# Patient Record
Sex: Male | Born: 1955 | Race: White | Hispanic: Yes | Smoking: Former smoker
Health system: Southern US, Community
[De-identification: ages and names within clinical notes are randomized; demographics above are authoritative.]

## PROBLEM LIST (undated history)

## (undated) DIAGNOSIS — F419 Anxiety disorder, unspecified: Secondary | ICD-10-CM

## (undated) DIAGNOSIS — I10 Essential (primary) hypertension: Secondary | ICD-10-CM

## (undated) DIAGNOSIS — F32A Depression, unspecified: Secondary | ICD-10-CM

## (undated) DIAGNOSIS — J302 Other seasonal allergic rhinitis: Secondary | ICD-10-CM

## (undated) DIAGNOSIS — Z973 Presence of spectacles and contact lenses: Secondary | ICD-10-CM

## (undated) DIAGNOSIS — E079 Disorder of thyroid, unspecified: Secondary | ICD-10-CM

## (undated) DIAGNOSIS — Z860101 Personal history of adenomatous and serrated colon polyps: Secondary | ICD-10-CM

## (undated) DIAGNOSIS — Z86010 Personal history of colonic polyps: Secondary | ICD-10-CM

## (undated) DIAGNOSIS — R972 Elevated prostate specific antigen [PSA]: Secondary | ICD-10-CM

## (undated) HISTORY — PX: VASECTOMY: SHX75

---

## 2001-11-05 ENCOUNTER — Emergency Department (HOSPITAL_COMMUNITY): Admission: EM | Admit: 2001-11-05 | Discharge: 2001-11-06 | Payer: Self-pay | Admitting: Emergency Medicine

## 2001-11-06 ENCOUNTER — Encounter: Payer: Self-pay | Admitting: Emergency Medicine

## 2003-01-07 ENCOUNTER — Encounter: Payer: Self-pay | Admitting: Emergency Medicine

## 2003-01-07 ENCOUNTER — Emergency Department (HOSPITAL_COMMUNITY): Admission: EM | Admit: 2003-01-07 | Discharge: 2003-01-07 | Payer: Self-pay | Admitting: Emergency Medicine

## 2004-08-01 ENCOUNTER — Emergency Department (HOSPITAL_COMMUNITY): Admission: EM | Admit: 2004-08-01 | Discharge: 2004-08-01 | Payer: Self-pay | Admitting: Emergency Medicine

## 2004-10-06 ENCOUNTER — Emergency Department (HOSPITAL_COMMUNITY): Admission: EM | Admit: 2004-10-06 | Discharge: 2004-10-07 | Payer: Self-pay | Admitting: Emergency Medicine

## 2005-12-25 IMAGING — CR DG CHEST 2V
2 series · 2 of 2 positions shown · non-contrast
Comparison: none

CLINICAL DATA: Weakness.  Lethargy.  
 CHEST - 2 VIEWS: 
 Peribronchial thickening noted without focal airspace disease.  Lungs otherwise clear.  Cardiomediastinal silhouette, bony thorax and upper abdomen are unremarkable.

[view not recorded (1 of 2)]
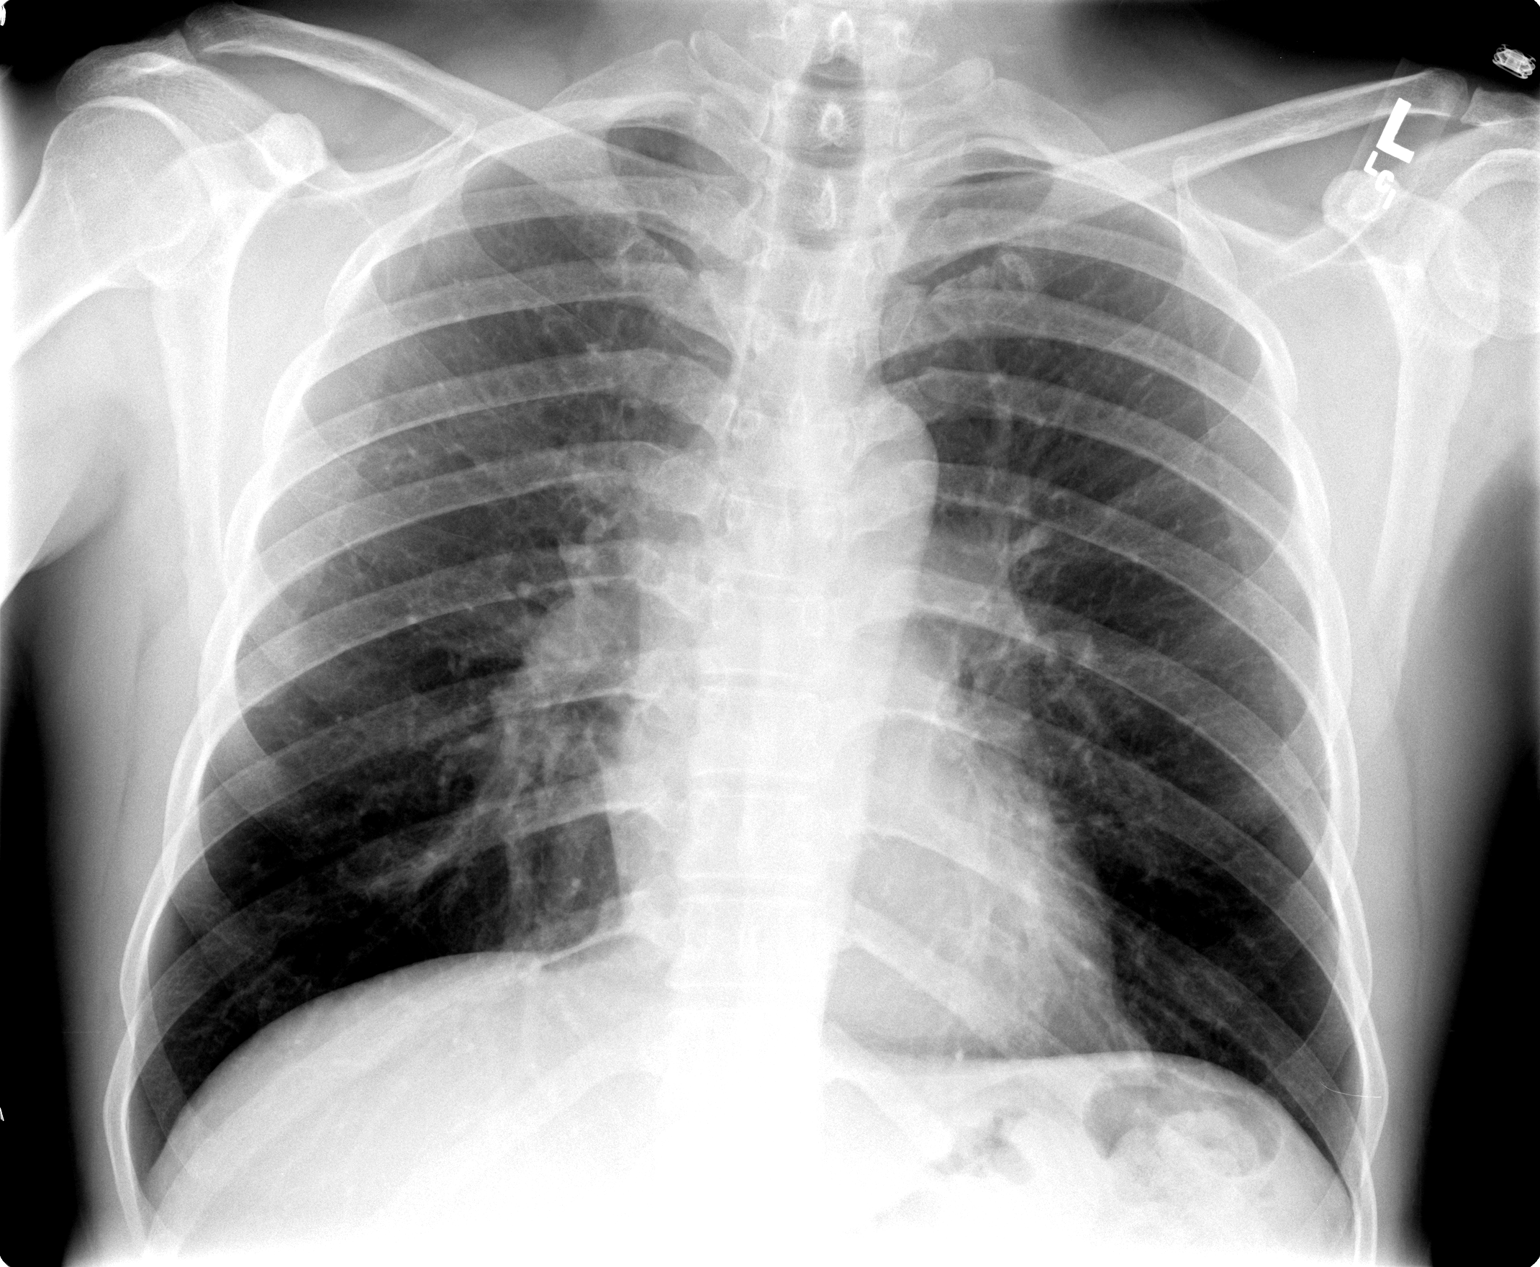

[view not recorded (2 of 2)]
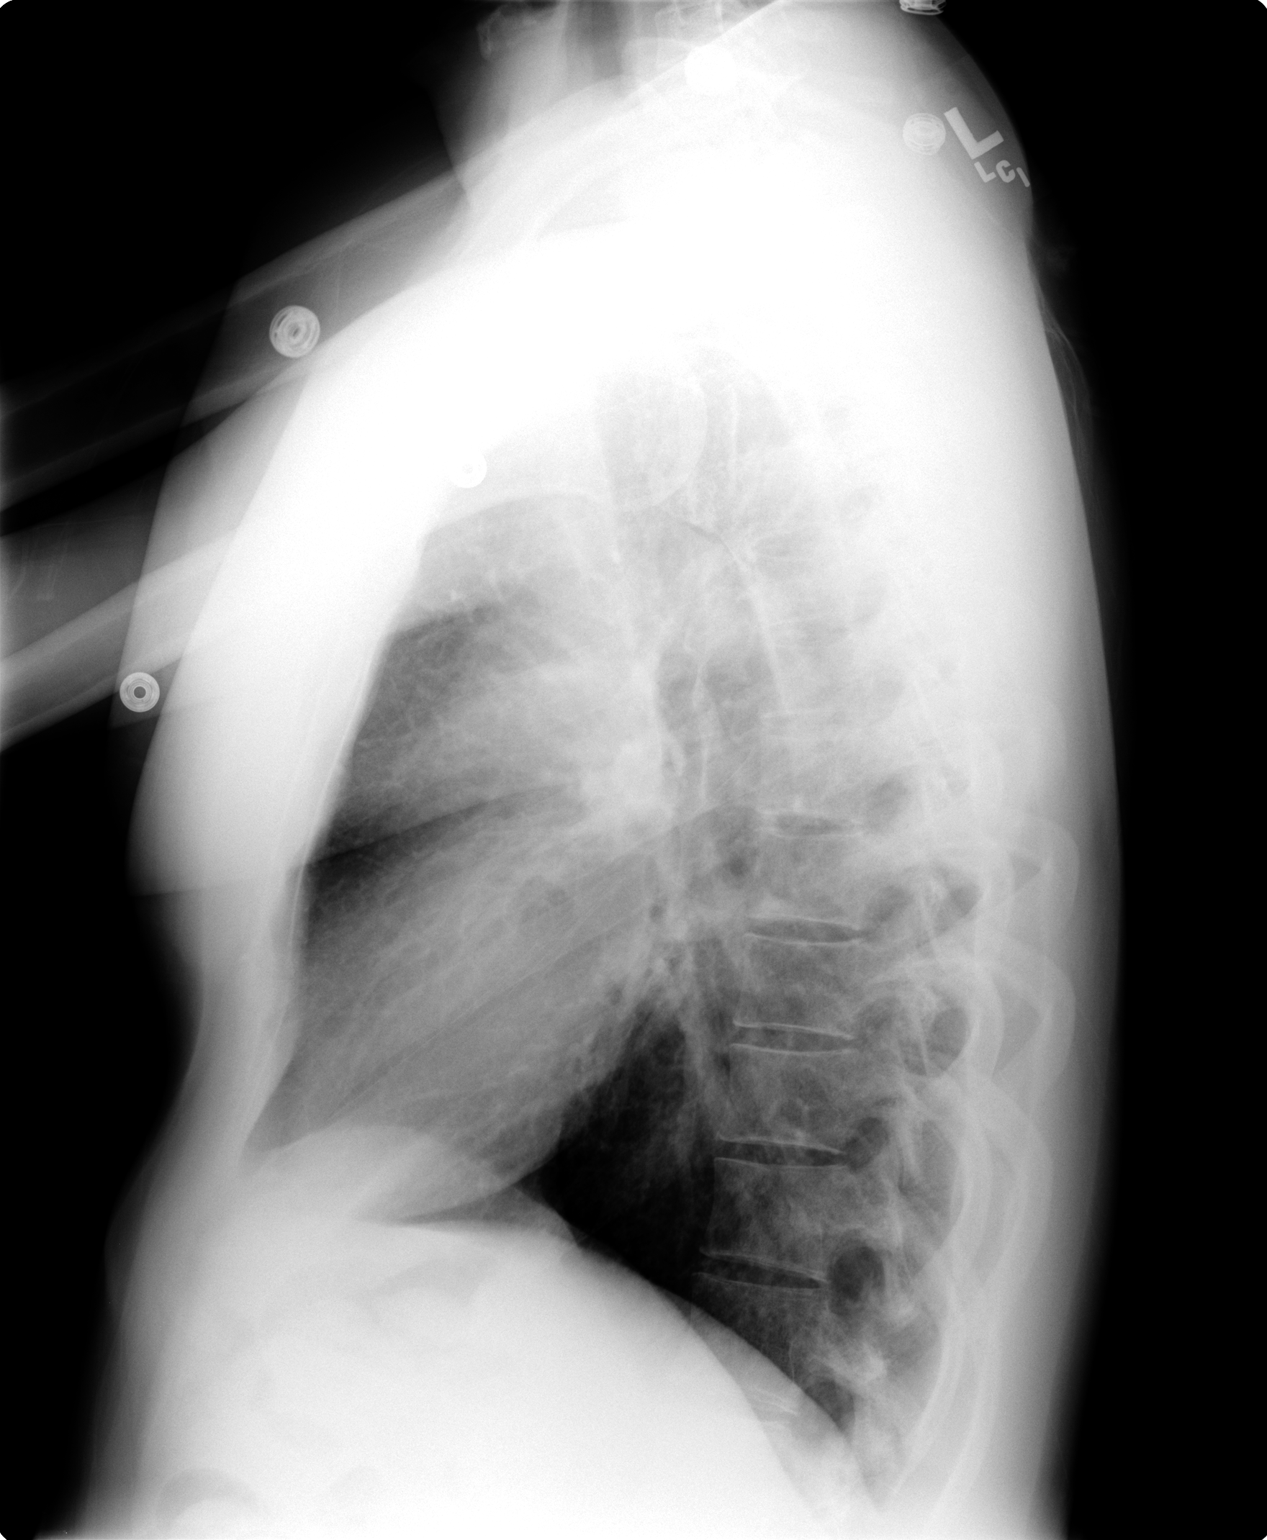

[2 of 2 positions shown; findings below may reference images not displayed]

IMPRESSION: Mild peribronchial thickening without focal airspace disease.

## 2012-05-13 ENCOUNTER — Ambulatory Visit: Payer: Self-pay | Admitting: Cardiology

## 2012-06-19 ENCOUNTER — Ambulatory Visit: Payer: Self-pay | Admitting: Cardiology

## 2012-06-19 ENCOUNTER — Telehealth: Payer: Self-pay | Admitting: *Deleted

## 2012-06-19 NOTE — Telephone Encounter (Signed)
Called Alex Curtis at Dr. Jenene Slicker request b/c Alex Curtis missed appt today. Alex Curtis states that he currently has a family emergency and that he called office earlier to cancel appointment. He will call back at a later time to reschedule.  Dr. Antoine Poche is aware. Mylo Red RN

## 2012-07-04 ENCOUNTER — Encounter: Payer: Self-pay | Admitting: Cardiology

## 2012-12-08 ENCOUNTER — Encounter: Payer: Self-pay | Admitting: Gastroenterology

## 2013-01-12 ENCOUNTER — Ambulatory Visit (AMBULATORY_SURGERY_CENTER): Payer: BC Managed Care – PPO | Admitting: *Deleted

## 2013-01-12 VITALS — Ht 68.0 in | Wt 153.4 lb

## 2013-01-12 DIAGNOSIS — Z1211 Encounter for screening for malignant neoplasm of colon: Secondary | ICD-10-CM

## 2013-01-12 MED ORDER — NA SULFATE-K SULFATE-MG SULF 17.5-3.13-1.6 GM/177ML PO SOLN: 1.0000 | Freq: Once | ORAL | Status: DC

## 2013-01-12 NOTE — Progress Notes (Signed)
No egg or soy allergy. ewm No previous sedation. ewm No home 02 use. ewm Pt very anxious and nervous about iv/needles. Pt states he will  pass out. ewm

## 2013-01-13 ENCOUNTER — Encounter: Payer: Self-pay | Admitting: Gastroenterology

## 2013-01-22 ENCOUNTER — Encounter: Payer: Self-pay | Admitting: Gastroenterology

## 2013-01-22 ENCOUNTER — Ambulatory Visit (AMBULATORY_SURGERY_CENTER): Payer: BC Managed Care – PPO | Admitting: Gastroenterology

## 2013-01-22 VITALS — BP 120/63 | HR 92 | Temp 98.4°F | Resp 21 | Ht 68.0 in | Wt 153.4 lb

## 2013-01-22 DIAGNOSIS — D126 Benign neoplasm of colon, unspecified: Secondary | ICD-10-CM

## 2013-01-22 DIAGNOSIS — Z1211 Encounter for screening for malignant neoplasm of colon: Secondary | ICD-10-CM

## 2013-01-22 HISTORY — PX: COLONOSCOPY WITH PROPOFOL: SHX5780

## 2013-01-22 MED ORDER — SODIUM CHLORIDE 0.9 % IV SOLN: 500.0000 mL | INTRAVENOUS | Status: DC

## 2013-01-22 NOTE — Op Note (Signed)
[facility] [address] [place], [address]   COLONOSCOPY PROCEDURE REPORT  PATIENT: Alex, Curtis  MR#: [id number] BIRTHDATE: [birth year] , 56  yrs. old GENDER: Male ENDOSCOPIST: Louis Meckel, MD REFERRED [id number] Blount, M.D. PROCEDURE DATE:  01/22/2013 PROCEDURE:   Colonoscopy with cold biopsy polypectomy ASA CLASS:   Class I INDICATIONS:average risk screening. MEDICATIONS: MAC sedation, administered by CRNA and propofol (Diprivan) 350mg  IV  DESCRIPTION OF PROCEDURE:   After the risks benefits and alternatives of the procedure were thoroughly explained, informed consent was obtained.  A digital rectal exam revealed no abnormalities of the rectum.   The LB [id number]  endoscope was introduced through the anus and advanced to the cecum, which was identified by both the appendix and ileocecal valve. No adverse events experienced.   The quality of the prep was excellent using Suprep  The instrument was then slowly withdrawn as the colon was fully examined.      COLON FINDINGS: A sessile polyp measuring 2 mm in size was found in the ascending colon.  A polypectomy was performed with cold forceps.  The resection was complete and the polyp tissue was completely retrieved.   Mild diverticulosis was noted in the ascending colon.   The colon mucosa was otherwise normal. Retroflexed views revealed no abnormalities. The time to cecum=2 minutes 17 seconds.  Withdrawal time=7 minutes 50 seconds.  The scope was withdrawn and the procedure completed. COMPLICATIONS: There were no complications.  ENDOSCOPIC IMPRESSION: 1.   Sessile polyp measuring 2 mm in size was found in the ascending colon; polypectomy was performed with cold forceps 2.   Mild diverticulosis was noted in the ascending colon 3.   The colon mucosa was otherwise normal  RECOMMENDATIONS: If the polyp(s) removed today are proven to be adenomatous (pre-cancerous) polyps, you will need a  repeat colonoscopy in 5 years.  Otherwise you should continue to follow colorectal cancer screening guidelines for "routine risk" patients with colonoscopy in 10 years.  You will receive a letter within 1-2 weeks with the results of your biopsy as well as final recommendations.  Please call my office if you have not received a letter after 3 weeks.   eSigned:  Louis Meckel, MD 01/22/2013 11:41 AM   cc:   PATIENT NAME:  Alex, Curtis MR#: [id number]

## 2013-01-22 NOTE — Progress Notes (Signed)
Pt is very nervous.  He was extremely nervous about IV.  Clide Cliff, RN did an excellent job with IV placement in right ac.  I encouraged the pt to take slow deep breaths while she placed IV.  The pt followed commands well and tolerated it ok.  He was relieved once IV was in place.  Pt still anxious about the colonoscopy.  I explained what was to happened and he did seem to be thankful.  Maw

## 2013-01-22 NOTE — Patient Instructions (Addendum)

## 2013-01-22 NOTE — Progress Notes (Signed)
Patient did not experience any of the following events: a burn prior to discharge; a fall within the facility; wrong site/side/patient/procedure/implant event; or a hospital transfer or hospital admission upon discharge from the facility. (G8907) Patient did not have preoperative order for IV antibiotic SSI prophylaxis. (G8918)  

## 2013-01-22 NOTE — Progress Notes (Signed)
Called to room to assist during endoscopic procedure.  Patient ID and intended procedure confirmed with present staff. Received instructions for my participation in the procedure from the performing physician.  

## 2013-01-23 ENCOUNTER — Telehealth: Payer: Self-pay | Admitting: *Deleted

## 2013-01-23 NOTE — Telephone Encounter (Signed)
No answer, left message to call if questions or concerns. 

## 2013-02-02 ENCOUNTER — Encounter: Payer: Self-pay | Admitting: Gastroenterology

## 2013-07-04 ENCOUNTER — Emergency Department (HOSPITAL_COMMUNITY)
Admission: EM | Admit: 2013-07-04 | Discharge: 2013-07-04 | Disposition: A | Discharge status: Home / Self Care | Payer: BC Managed Care – PPO | Attending: Emergency Medicine | Admitting: Emergency Medicine

## 2013-07-04 ENCOUNTER — Encounter (HOSPITAL_COMMUNITY): Payer: Self-pay | Admitting: Emergency Medicine

## 2013-07-04 ENCOUNTER — Emergency Department (HOSPITAL_COMMUNITY): Payer: BC Managed Care – PPO

## 2013-07-04 DIAGNOSIS — R002 Palpitations: Secondary | ICD-10-CM | POA: Insufficient documentation

## 2013-07-04 DIAGNOSIS — I10 Essential (primary) hypertension: Secondary | ICD-10-CM | POA: Insufficient documentation

## 2013-07-04 DIAGNOSIS — Z79899 Other long term (current) drug therapy: Secondary | ICD-10-CM | POA: Insufficient documentation

## 2013-07-04 DIAGNOSIS — R000 Tachycardia, unspecified: Secondary | ICD-10-CM

## 2013-07-04 DIAGNOSIS — E0590 Thyrotoxicosis, unspecified without thyrotoxic crisis or storm: Secondary | ICD-10-CM | POA: Insufficient documentation

## 2013-07-04 DIAGNOSIS — F411 Generalized anxiety disorder: Secondary | ICD-10-CM | POA: Insufficient documentation

## 2013-07-04 DIAGNOSIS — R079 Chest pain, unspecified: Secondary | ICD-10-CM | POA: Insufficient documentation

## 2013-07-04 DIAGNOSIS — Z87891 Personal history of nicotine dependence: Secondary | ICD-10-CM | POA: Insufficient documentation

## 2013-07-04 HISTORY — DX: Disorder of thyroid, unspecified: E07.9

## 2013-07-04 HISTORY — DX: Anxiety disorder, unspecified: F41.9

## 2013-07-04 HISTORY — DX: Essential (primary) hypertension: I10

## 2013-07-04 LAB — CBC WITH DIFFERENTIAL/PLATELET
Basophils Absolute: 0 10*3/uL (ref 0.0–0.1)
Eosinophils Absolute: 0.1 10*3/uL (ref 0.0–0.7)
Eosinophils Relative: 3 % (ref 0–5)
HCT: 43 % (ref 39.0–52.0)
Lymphocytes Relative: 40 % (ref 12–46)
MCH: 28.1 pg (ref 26.0–34.0)
MCV: 76.9 fL — ABNORMAL LOW (ref 78.0–100.0)
Monocytes Absolute: 0.5 10*3/uL (ref 0.1–1.0)
Monocytes Relative: 10 % (ref 3–12)
Platelets: 204 10*3/uL (ref 150–400)
RDW: 12.8 % (ref 11.5–15.5)
WBC: 5.2 10*3/uL (ref 4.0–10.5)

## 2013-07-04 LAB — BASIC METABOLIC PANEL
CO2: 24 mEq/L (ref 19–32)
Calcium: 9.5 mg/dL (ref 8.4–10.5)
Creatinine, Ser: 0.71 mg/dL (ref 0.50–1.35)
GFR calc non Af Amer: >90 mL/min (ref >90)
Glucose, Bld: 101 mg/dL — ABNORMAL HIGH (ref 70–99)
Sodium: 139 mEq/L (ref 135–145)

## 2013-07-04 LAB — POCT I-STAT TROPONIN I: Troponin i, poc: 0 ng/mL (ref 0.00–0.08)

## 2013-07-04 MED ORDER — ASPIRIN EC 325 MG PO TBEC
325.0000 mg | DELAYED_RELEASE_TABLET | Freq: Once | ORAL | Status: AC
  Administered 2013-07-04: 325 mg via ORAL
  Filled 2013-07-04: qty 1

## 2013-07-04 NOTE — ED Notes (Signed)
Patient transported to X-ray 

## 2013-07-04 NOTE — ED Notes (Signed)
Dr. Lynelle Doctor at the bedside. Pt slowed down to 88 on the monitor at this time.

## 2013-07-04 NOTE — ED Notes (Signed)
Dr. Post at the bedside.

## 2013-07-04 NOTE — ED Notes (Signed)
Pt reports chest pain and fast HR since noon today. Pt denies any SOB or N/V. Pt did report diaphoresis. Started a new medication a week ago on Methimazole

## 2013-07-04 NOTE — ED Notes (Signed)
Pt asks if the heater is on. Feels like he is burning from the inside.

## 2013-07-04 NOTE — ED Provider Notes (Signed)
CSN: [id number]     Arrival date & time 07/04/13  1739 History   None    Chief Complaint  Patient presents with  . Tachycardia   (Consider location/radiation/quality/duration/timing/severity/associated sxs/prior Treatment) HPI Comments: 57 year old male with history of hyperthyroidism hypertension who comes in with complaints of palpitations/tachycardia and chest pain. Approximately noon today patient began having palpitations. States he did feel his heart racing. Along with this palpitation feeling he was having chest pain which he described as a chest discomfort. Unable to further qualification pain. States this was 6/10 located in the central region his chest without radiation. It is been fairly constant since noon. Patient began taking methimazol yesterday for hyperthyroidism. Has never had symptoms like this before. No other changes in health. Has been taking all medications as direct   Patient is a 57 y.o. male presenting with palpitations.  Palpitations Palpitations quality:  Fast Onset quality:  Sudden Duration:  6 hours Timing:  Constant Progression:  Unchanged Chronicity:  New Context: not anxiety   Context comment:  Hyperthyroid just started methamazole Relieved by:  Nothing Associated symptoms: chest pain   Associated symptoms: no shortness of breath       Past Medical History  Diagnosis Date  . Thyroid disease   . Anxiety   . Hypertension    Past Surgical History  Procedure Laterality Date  . Vasectomy     Family History  Problem Relation Age of Onset  . Hypertension Mother   . Colon cancer Neg Hx   . Esophageal cancer Neg Hx   . Rectal cancer Neg Hx   . Stomach cancer Neg Hx    History  Substance Use Topics  . Smoking status: Former Games developer  . Smokeless tobacco: Never Used  . Alcohol Use: 0.0 oz/week     Comment: 1 glass of wine once a week    Review of Systems  Constitutional: Negative for fatigue.  Respiratory: Negative for shortness of breath.    Cardiovascular: Positive for chest pain and palpitations.  Genitourinary: Negative for dysuria.  Skin: Negative for rash.  Neurological: Negative for headaches.  Psychiatric/Behavioral: Negative for behavioral problems.  All other systems reviewed and are negative.    Allergies  Review of patient's allergies indicates no known allergies.  Home Medications   Current Outpatient Rx  Name  Route  Sig  Dispense  Refill  . methimazole (TAPAZOLE) 10 MG tablet   Oral   Take 10 mg by mouth 2 (two) times daily.         Marland Kitchen PARoxetine (PAXIL) 20 MG tablet   Oral   Take 20 mg by mouth every morning.         . propranolol ER (INDERAL LA) 120 MG 24 hr capsule   Oral   Take 120 mg by mouth daily.          BP 132/93  Pulse 86  Resp 17  SpO2 98% Physical Exam  Nursing note and vitals reviewed. Constitutional: He is oriented to person, place, and time. He appears well-developed and well-nourished.  HENT:  Head: Normocephalic and atraumatic.  Eyes: EOM are normal. Pupils are equal, round, and reactive to light.  Neck: Normal range of motion.  Cardiovascular: Regular rhythm, normal heart sounds and intact distal pulses.   No murmur heard. Tachycardic   Pulmonary/Chest: Effort normal and breath sounds normal. No respiratory distress. He has no wheezes. He exhibits no tenderness.  Abdominal: Soft. He exhibits no distension. There is no tenderness. There is no  rebound and no guarding.  Musculoskeletal: Normal range of motion.  Neurological: He is alert and oriented to person, place, and time. No cranial nerve deficit. He exhibits normal muscle tone. Coordination normal.  Skin: Skin is warm and dry. No rash noted.  Psychiatric: He has a normal mood and affect. His behavior is normal. Judgment and thought content normal.    ED Course  Procedures (including critical care time) Labs Review Labs Reviewed  CBC WITH DIFFERENTIAL - Abnormal; Notable for the following:    MCV 76.9 (*)     MCHC 36.5 (*)    All other components within normal limits  BASIC METABOLIC PANEL - Abnormal; Notable for the following:    Glucose, Bld 101 (*)    All other components within normal limits  POCT I-STAT TROPONIN I  POCT I-STAT TROPONIN I   Imaging Review Dg Chest 2 View  07/04/2013   CLINICAL DATA:  Hyperthyroidism. Now with tachycardia. Near syncope.  EXAM: CHEST  2 VIEW  COMPARISON:  None.  FINDINGS: The heart size and mediastinal contours are within normal limits. Both lungs are clear. The visualized skeletal structures are unremarkable.  IMPRESSION: No active cardiopulmonary disease.   Electronically Signed   By: Signa Kell M.D.   On: 07/04/2013 18:58    EKG Interpretation    Date/Time:  Saturday July 04 2013 17:54:33 EST Ventricular Rate:  96 PR Interval:  130 QRS Duration: 89 QT Interval:  360 QTC Calculation: 455 R Axis:   78 Text Interpretation:  Sinus rhythm Since last tracing Premature ventricular complexes resolved and rate is slower Confirmed by KNAPP  MD-J, JON ([id number]) on 07/04/2013 6:09:44 PM            MDM   1. Tachycardia   2. Hyperthyroidism     On arrival afebrile vital signs within normal limits. Patient arrives with tachycardia palpitations and initial pulse in triage note to be 130. EKG findings as noted above. No concerning signs for ischemia. Cardiac enzymes negative x2. Patient complaining of only slight pressure associated with palpitations. Did not lose to be CAD. Once patient in treatment room heart rate noted to be in the 90s and pressure/pain no longer present. Patient with known history of hyperthyroidism recently begun on methimazole therapy. Patient with signs and symptoms consistent with thyroid-related issues. Patient had elevated heart rate, blood pressure and feelings of anxiety and nervousness. These are consistent with likely thyroid-related issue. These resolved with no treatment. She is on propranolol for control of these symptoms  as well as hypertension. Remainder of patient's labs within normal limits. Chest x-ray no concerning evidence of cardiopulmonary disease. Patient's issues not consistent with aortic pathology, pulmonary embolism or other concerning thoracic pathology. The patient likely thyroid issue and the issue should subside with continued treatment with methimazole. Patient was observed for several hours in the emergency department maintaining normal sinus rhythm with no further complaints of palpitations or other concerning signs or symptoms. Deemed appropriate for discharge. Patient given return precautions and discharged no further acute issues.   Bridgett Larsson, MD 07/04/13 2101

## 2013-07-04 NOTE — ED Provider Notes (Signed)
I saw and evaluated the patient, reviewed the resident's note and I agree with the findings and plan.  EKG Interpretation    Date/Time:  Saturday July 04 2013 17:42:36 EST Ventricular Rate:  135 PR Interval:  122 QRS Duration: 70 QT Interval:  302 QTC Calculation: 453 R Axis:   77 Text Interpretation:  Sinus tachycardia with frequent Premature ventricular complexes Biatrial enlargement Left ventricular hypertrophy Abnormal ECG No previous tracing Confirmed by Esme Freund  MD-J, Kosei Rhodes ([id number]) on 07/04/2013 6:08:53 PM           EKG Interpretation    Date/Time:  Saturday July 04 2013 17:54:33 EST Ventricular Rate:  96 PR Interval:  130 QRS Duration: 89 QT Interval:  360 QTC Calculation: 455 R Axis:   78 Text Interpretation:  Sinus rhythm Since last tracing Premature ventricular complexes resolved and rate is slower Confirmed by Sylas Twombly  MD-J, Suzanne Kho ([id number]) on 07/04/2013 6:09:44 PM            Pt presents to the ED with tachycardia.  History of thyroid problems and just started methimazole.  2nd ekg with heart rate down in the 90s.  Clinically doubt thyroid storm.  Will check labs and monitor.      Celene Kras, MD 07/04/13 2103

## 2013-07-30 DIAGNOSIS — E0500 Thyrotoxicosis with diffuse goiter without thyrotoxic crisis or storm: Secondary | ICD-10-CM

## 2013-07-30 HISTORY — DX: Thyrotoxicosis with diffuse goiter without thyrotoxic crisis or storm: E05.00

## 2013-08-13 ENCOUNTER — Other Ambulatory Visit (HOSPITAL_COMMUNITY): Payer: Self-pay | Admitting: Endocrinology

## 2013-08-13 DIAGNOSIS — E0590 Thyrotoxicosis, unspecified without thyrotoxic crisis or storm: Secondary | ICD-10-CM

## 2013-08-14 ENCOUNTER — Other Ambulatory Visit (HOSPITAL_COMMUNITY): Payer: Self-pay | Admitting: Endocrinology

## 2013-08-14 DIAGNOSIS — E0590 Thyrotoxicosis, unspecified without thyrotoxic crisis or storm: Secondary | ICD-10-CM

## 2013-09-07 ENCOUNTER — Encounter (HOSPITAL_COMMUNITY)
Admission: RE | Admit: 2013-09-07 | Discharge: 2013-09-07 | Disposition: A | Discharge status: Home / Self Care | Payer: BC Managed Care – PPO | Source: Ambulatory Visit | Attending: Endocrinology | Admitting: Endocrinology

## 2013-09-07 DIAGNOSIS — E0590 Thyrotoxicosis, unspecified without thyrotoxic crisis or storm: Secondary | ICD-10-CM | POA: Insufficient documentation

## 2013-09-08 ENCOUNTER — Encounter (HOSPITAL_COMMUNITY)
Admission: RE | Admit: 2013-09-08 | Discharge: 2013-09-08 | Disposition: A | Discharge status: Home / Self Care | Payer: BC Managed Care – PPO | Source: Ambulatory Visit | Attending: Endocrinology | Admitting: Endocrinology

## 2013-09-08 MED ORDER — SODIUM IODIDE I 131 CAPSULE
14.0000 | Freq: Once | INTRAVENOUS | Status: AC | PRN
  Administered 2013-09-08: 14 via ORAL

## 2013-09-08 MED ORDER — SODIUM PERTECHNETATE TC 99M INJECTION
10.5000 | Freq: Once | INTRAVENOUS | Status: AC | PRN
  Administered 2013-09-08: 11 via INTRAVENOUS

## 2013-09-18 ENCOUNTER — Encounter (HOSPITAL_COMMUNITY): Admission: RE | Admit: 2013-09-18 | Payer: BC Managed Care – PPO | Source: Ambulatory Visit

## 2014-05-10 ENCOUNTER — Other Ambulatory Visit (HOSPITAL_COMMUNITY): Payer: Self-pay | Admitting: Endocrinology

## 2014-05-10 DIAGNOSIS — E0500 Thyrotoxicosis with diffuse goiter without thyrotoxic crisis or storm: Secondary | ICD-10-CM

## 2014-05-17 ENCOUNTER — Encounter (HOSPITAL_COMMUNITY)
Admission: RE | Admit: 2014-05-17 | Discharge: 2014-05-17 | Disposition: A | Discharge status: Home / Self Care | Payer: BC Managed Care – PPO | Source: Ambulatory Visit | Attending: Endocrinology | Admitting: Endocrinology

## 2014-11-15 ENCOUNTER — Ambulatory Visit: Payer: Self-pay | Attending: Family Medicine

## 2015-08-17 ENCOUNTER — Ambulatory Visit (INDEPENDENT_AMBULATORY_CARE_PROVIDER_SITE_OTHER): Payer: BLUE CROSS/BLUE SHIELD | Admitting: Emergency Medicine

## 2015-08-17 VITALS — BP 130/80 | HR 83 | Temp 98.4°F | Resp 20 | Ht 68.0 in | Wt 171.8 lb

## 2015-08-17 DIAGNOSIS — J302 Other seasonal allergic rhinitis: Secondary | ICD-10-CM | POA: Diagnosis not present

## 2015-08-17 MED ORDER — TRIAMCINOLONE ACETONIDE 55 MCG/ACT NA AERO: 2.0000 | INHALATION_SPRAY | Freq: Every day | NASAL | Status: DC

## 2015-08-17 NOTE — Patient Instructions (Signed)

## 2015-08-17 NOTE — Progress Notes (Signed)
Subjective:  Patient ID: Alex Curtis, male    DOB: April 14, 1956  Age: 60 y.o. MRN: [id number]  CC: Sore Throat   HPI Alex Curtis presents   Is a 6 month history of nasal congestion and postnasal drainage. He tried over-the-counter allergy medicine with no improvement. He has no fever or chills. No wheezing or shortness of breath. No cough.. No nausea vomiting or stool change. No rash.  History Alex Curtis has a past medical history of Thyroid disease; Anxiety; and Hypertension.   He has past surgical history that includes Vasectomy.   His  family history includes Hypertension in his mother. There is no history of Colon cancer, Esophageal cancer, Rectal cancer, or Stomach cancer.  He   reports that he has quit smoking. He has never used smokeless tobacco. He reports that he drinks alcohol. He reports that he does not use illicit drugs.  Outpatient Prescriptions Prior to Visit  Medication Sig Dispense Refill  . PARoxetine (PAXIL) 20 MG tablet Take 20 mg by mouth every morning.    . propranolol ER (INDERAL LA) 120 MG 24 hr capsule Take 120 mg by mouth daily.    . methimazole (TAPAZOLE) 10 MG tablet Take 10 mg by mouth 2 (two) times daily. Reported on 08/17/2015     No facility-administered medications prior to visit.    Social History   Social History  . Marital Status: Married    Spouse Name: N/A  . Number of Children: N/A  . Years of Education: N/A   Social History Main Topics  . Smoking status: Former Research scientist (life sciences)  . Smokeless tobacco: Never Used  . Alcohol Use: 0.0 oz/week     Comment: 1 glass of wine once a week  . Drug Use: No  . Sexual Activity: Not Asked   Other Topics Concern  . None   Social History Narrative     Review of Systems  Constitutional: Negative for fever, chills and appetite change.  HENT: Positive for postnasal drip. Negative for congestion, ear pain, sinus pressure and sore throat.   Eyes: Negative for pain and redness.  Respiratory: Negative for  cough, shortness of breath and wheezing.   Cardiovascular: Negative for leg swelling.  Gastrointestinal: Negative for nausea, vomiting, abdominal pain, diarrhea, constipation and blood in stool.  Endocrine: Negative for polyuria.  Genitourinary: Negative for dysuria, urgency, frequency and flank pain.  Musculoskeletal: Negative for gait problem.  Skin: Negative for rash.  Neurological: Negative for weakness and headaches.  Psychiatric/Behavioral: Negative for confusion and decreased concentration. The patient is not nervous/anxious.     Objective:  BP 130/80 mmHg  Pulse 83  Temp(Src) 98.4 F (36.9 C) (Oral)  Resp 20  Ht 5\' 8"  (1.727 m)  Wt 171 lb 12.8 oz (77.928 kg)  BMI 26.13 kg/m2  SpO2 98%  Physical Exam  Constitutional: He is oriented to person, place, and time. He appears well-developed and well-nourished. No distress.  HENT:  Head: Normocephalic and atraumatic.  Right Ear: External ear normal.  Left Ear: External ear normal.  Nose: Nose normal.  Eyes: Conjunctivae and EOM are normal. Pupils are equal, round, and reactive to light. No scleral icterus.  Neck: Normal range of motion. Neck supple. No tracheal deviation present.  Cardiovascular: Normal rate, regular rhythm and normal heart sounds.   Pulmonary/Chest: Effort normal. No respiratory distress. He has no wheezes. He has no rales.  Abdominal: He exhibits no mass. There is no tenderness. There is no rebound and no guarding.  Musculoskeletal:  He exhibits no edema.  Lymphadenopathy:    He has no cervical adenopathy.  Neurological: He is alert and oriented to person, place, and time. Coordination normal.  Skin: Skin is warm and dry. No rash noted.  Psychiatric: He has a normal mood and affect. His behavior is normal.      Assessment & Plan:   Alex Curtis was seen today for sore throat.  Diagnoses and all orders for this visit:  Seasonal allergic rhinitis  Other orders -     triamcinolone (NASACORT AQ) 55 MCG/ACT  AERO nasal inhaler; Place 2 sprays into the nose daily.  I am having Mr. Stoddart start on triamcinolone. I am also having him maintain his PARoxetine, propranolol ER, and methimazole.  Meds ordered this encounter  Medications  . triamcinolone (NASACORT AQ) 55 MCG/ACT AERO nasal inhaler    Sig: Place 2 sprays into the nose daily.    Dispense:  1 Inhaler    Refill:  12    Appropriate red flag conditions were discussed with the patient as well as actions that should be taken.  Patient expressed his understanding.  Follow-up: Return if symptoms worsen or fail to improve.  Roselee Culver, MD

## 2015-08-18 ENCOUNTER — Telehealth: Payer: Self-pay | Admitting: Family Medicine

## 2015-08-18 NOTE — Telephone Encounter (Signed)
Flonase, nasonex, rhinocort.  Lady's choice

## 2015-08-18 NOTE — Telephone Encounter (Signed)
[facility] called regarding rx sent in yesterday for Nasacort.  Nasacort on back order can we change to something else? Please advise

## 2015-09-03 ENCOUNTER — Ambulatory Visit (INDEPENDENT_AMBULATORY_CARE_PROVIDER_SITE_OTHER): Payer: BLUE CROSS/BLUE SHIELD | Admitting: Family Medicine

## 2015-09-03 VITALS — BP 140/72 | HR 69 | Temp 97.7°F | Resp 16 | Ht 68.0 in | Wt 171.8 lb

## 2015-09-03 DIAGNOSIS — R6889 Other general symptoms and signs: Secondary | ICD-10-CM

## 2015-09-03 DIAGNOSIS — Z8639 Personal history of other endocrine, nutritional and metabolic disease: Secondary | ICD-10-CM | POA: Diagnosis not present

## 2015-09-03 DIAGNOSIS — R0683 Snoring: Secondary | ICD-10-CM

## 2015-09-03 LAB — COMPLETE METABOLIC PANEL WITH GFR
ALBUMIN: 4.1 g/dL (ref 3.6–5.1)
ALT: 12 U/L (ref 9–46)
AST: 14 U/L (ref 10–35)
Alkaline Phosphatase: 71 U/L (ref 40–115)
BILIRUBIN TOTAL: 0.9 mg/dL (ref 0.2–1.2)
BUN: 17 mg/dL (ref 7–25)
CO2: 30 mmol/L (ref 20–31)
CREATININE: 1.16 mg/dL (ref 0.70–1.33)
Calcium: 9.2 mg/dL (ref 8.6–10.3)
Chloride: 103 mmol/L (ref 98–110)
GFR, Est African American: 80 mL/min (ref >=60)
GFR, Est Non African American: 69 mL/min (ref >=60)
GLUCOSE: 114 mg/dL — AB (ref 65–99)
POTASSIUM: 5 mmol/L (ref 3.5–5.3)
SODIUM: 138 mmol/L (ref 135–146)
TOTAL PROTEIN: 6.6 g/dL (ref 6.1–8.1)

## 2015-09-03 MED ORDER — PREDNISONE 20 MG PO TABS: ORAL_TABLET | ORAL | Status: DC

## 2015-09-03 NOTE — Patient Instructions (Signed)
We will schedule you for a sleep study. I suspect probable sleep apnea.   Take the prednisone each morning after breakfast 3 daily for 2 days, then 2 daily for 2 days, then 1 daily for 2 days to see if that will help you congestion. If it helps to dramatically please let me know.

## 2015-09-03 NOTE — Progress Notes (Signed)
Patient ID: Alex Curtis, male    DOB: 26-Apr-1956  Age: 60 y.o. MRN: [id number]  Chief Complaint  Patient presents with  . Follow-up    Clearing throat alot, with phlegm, no sore throat, no cough    Subjective:   Patient has been having a lot of trouble with phlegm in his throat. He wakes up frequently all night and has to clear his throat. He has just not been able to sleep soundly at all. He has daytime fatigue. He falls asleep quickly after supper. He works as a Theatre manager. He does not smoke. He has married to a very Young wife. He says he just doesn't have energy to keep up. He occasionally clears his throat daytime but it's primarily at night. Using the nasal steroid spray that he was previously given does not help at all. He does snore badly.  He had hyperthyroidism in the past which she says is resolved.  Current allergies, medications, problem list, past/family and social histories reviewed.  Objective:  BP 140/72 mmHg  Pulse 69  Temp(Src) 97.7 F (36.5 C) (Oral)  Resp 16  Ht 5\' 8"  (1.727 m)  Wt 171 lb 12.8 oz (77.928 kg)  BMI 26.13 kg/m2  SpO2 98%  No major acute distress. TMs normal. Nose clear. Throat clear. Neck supple without nodes. Chest clear. Heart regular without murmur.  Assessment & Plan:   Assessment: 1. History of hyperthyroidism   2. Congestion of throat   3. Snoring       Plan: I think the throat congestion is from mouth breathing and snoring. Will treat with steroids in case it is allergic, which should cause it to calm down quickly. Check a TSH and CMP on him also.    Orders Placed This Encounter  Procedures  . TSH  . COMPLETE METABOLIC PANEL WITH GFR  . Nocturnal polysomnography (NPSG)    Standing Status: Future     Number of Occurrences:      Standing Expiration Date: 09/02/2016    Scheduling Instructions:     Refer to Dr. Brett Fairy re snoring, fatigue, congestion    Order Specific Question:  Where should this test be performed:   Answer:  Other    Meds ordered this encounter  Medications  . predniSONE (DELTASONE) 20 MG tablet    Sig: Take 3 daily for 2 days, then 2 daily for 2 days, then 1 daily for 2 days after breakfast    Dispense:  6 tablet    Refill:  0         Patient Instructions  We will schedule you for a sleep study. I suspect probable sleep apnea.   Take the prednisone each morning after breakfast 3 daily for 2 days, then 2 daily for 2 days, then 1 daily for 2 days to see if that will help you congestion. If it helps to dramatically please let me know.     Return if symptoms worsen or fail to improve.   Dina Warbington, MD 09/03/2015

## 2015-09-04 ENCOUNTER — Other Ambulatory Visit: Payer: Self-pay | Admitting: Family Medicine

## 2015-09-04 DIAGNOSIS — E0590 Thyrotoxicosis, unspecified without thyrotoxic crisis or storm: Secondary | ICD-10-CM

## 2015-09-04 LAB — TSH: TSH: 0.013 u[IU]/mL — ABNORMAL LOW (ref 0.350–4.500)

## 2015-09-14 ENCOUNTER — Other Ambulatory Visit: Payer: Self-pay | Admitting: *Deleted

## 2015-09-14 DIAGNOSIS — Z8639 Personal history of other endocrine, nutritional and metabolic disease: Secondary | ICD-10-CM

## 2015-09-19 ENCOUNTER — Other Ambulatory Visit: Payer: Self-pay | Admitting: Family Medicine

## 2015-09-29 ENCOUNTER — Other Ambulatory Visit: Payer: Self-pay

## 2015-09-29 DIAGNOSIS — R0683 Snoring: Secondary | ICD-10-CM

## 2015-10-03 ENCOUNTER — Ambulatory Visit (INDEPENDENT_AMBULATORY_CARE_PROVIDER_SITE_OTHER): Payer: BLUE CROSS/BLUE SHIELD | Admitting: Internal Medicine

## 2015-10-03 VITALS — BP 151/94 | HR 74 | Temp 98.4°F | Resp 19 | Ht 68.0 in | Wt 175.0 lb

## 2015-10-03 DIAGNOSIS — Z8639 Personal history of other endocrine, nutritional and metabolic disease: Secondary | ICD-10-CM | POA: Diagnosis not present

## 2015-10-03 DIAGNOSIS — IMO0001 Reserved for inherently not codable concepts without codable children: Secondary | ICD-10-CM

## 2015-10-03 DIAGNOSIS — R030 Elevated blood-pressure reading, without diagnosis of hypertension: Secondary | ICD-10-CM | POA: Diagnosis not present

## 2015-10-03 MED ORDER — PROPRANOLOL HCL ER 80 MG PO CP24: 80.0000 mg | ORAL_CAPSULE | Freq: Every day | ORAL | Status: DC

## 2015-10-03 MED ORDER — PROPRANOLOL HCL ER 120 MG PO CP24: 120.0000 mg | ORAL_CAPSULE | Freq: Every day | ORAL | Status: DC

## 2015-10-03 NOTE — Progress Notes (Signed)
Subjective:  By signing my name below, I, Alex Curtis, attest that this documentation has been prepared under the direction and in the presence of Alex Lin, MD. Electronically Signed: Moises Curtis, Alex Curtis. 10/03/2015 , 2:54 PM .  Patient was seen in Room 8 .   Patient ID: Alex Curtis, male    DOB: 01/10/1956, 60 y.o.   MRN: [id number] Chief Complaint  Patient presents with  . Hypertension  . Medication Refill    propranolol 80 mg  . other    running nose   HPI Alex Curtis is a 60 y.o. male who presents to [facility] for medication refill for propranolol for HTN and hyperthyroidism. He was referred to endocrinologist during his last visit about a month ago. He received a call from them today.   He was diagnosed with HTN a year ago.  He receives paxil from [facility].   There are no active problems to display for this patient.   Current outpatient prescriptions:  .  PARoxetine (PAXIL) 20 MG tablet, Take 20 mg by mouth every morning., Disp: , Rfl:  .  propranolol ER (INDERAL LA) 120 MG 24 hr capsule, Take 1 capsule (120 mg total) by mouth daily., Disp: 90 capsule, Rfl: 3 .  predniSONE (DELTASONE) 20 MG tablet, Take 3 daily for 2 days, then 2 daily for 2 days, then 1 daily for 2 days after breakfast (Patient not taking: Reported on 10/03/2015), Disp: 6 tablet, Rfl: 0 .  triamcinolone (NASACORT AQ) 55 MCG/ACT AERO nasal inhaler, Place 2 sprays into the nose daily. (Patient not taking: Reported on 09/03/2015), Disp: 1 Inhaler, Rfl: 12  Review of Systems  Constitutional: Negative for fever, chills, activity change and fatigue.  Respiratory: Negative for cough.   Cardiovascular: Negative for chest pain.  Gastrointestinal: Negative for nausea, vomiting, abdominal pain and diarrhea.  Neurological: Negative for dizziness, weakness, light-headedness and headaches.      Objective:   Physical Exam  Constitutional: He is oriented to person, place, and time. He appears well-developed and  well-nourished. No distress.  HENT:  Head: Normocephalic and atraumatic.  Eyes: EOM are normal. Pupils are equal, round, and reactive to light.  Neck: Neck supple.  Cardiovascular: Normal rate.   Pulmonary/Chest: Effort normal. No respiratory distress.  Musculoskeletal: Normal range of motion.  Neurological: He is alert and oriented to person, place, and time.  Skin: Skin is warm and dry.  Psychiatric: He has a normal mood and affect. His behavior is normal.  Nursing note and vitals reviewed.   BP 151/94 mmHg  Pulse 74  Temp(Src) 98.4 F (36.9 C) (Oral)  Resp 19  Ht 5\' 8"  (1.727 m)  Wt 175 lb (79.379 kg)  BMI 26.61 kg/m2  SpO2 98%    Assessment & Plan:  I have completed the patient encounter in its entirety as documented by the scribe, with editing by me where necessary. Alex Curtis P. Alex Curtis, M.D.  History of hyperthyroidism  Elevated BP  Meds ordered this encounter  Medications  . DISCONTD: propranolol ER (INDERAL LA) 120 MG 24 hr capsule    Sig: Take 1 capsule (120 mg total) by mouth daily.    Dispense:  90 capsule    Refill:  3 Although our chart indicates 120 mg and there is nothing else I can find that indicates any difference, he produces a bottle from November from Alex Curtis at 80 mg and says he's never been on anything else.   . propranolol ER (INDERAL LA) 80 MG  24 hr capsule    Sig: Take 1 capsule (80 mg total) by mouth daily.    Dispense:  90 capsule    Refill:  1   It's unclear to me if his Curtis pressure is elevated due to hypertension or hip his Curtis pressure was discovered at the same time his hyperthyroidism was discovered. He will see an endocrinologist referral from Alex Curtis soon, and we will leave this question unanswered until his hyperthyroidism is controlled.

## 2015-10-19 ENCOUNTER — Telehealth: Payer: Self-pay

## 2015-10-19 ENCOUNTER — Institutional Professional Consult (permissible substitution): Payer: BLUE CROSS/BLUE SHIELD | Admitting: Neurology

## 2015-10-19 NOTE — Telephone Encounter (Signed)
Pt did not show for their appt with Dr. Dohmeier today.  

## 2015-10-20 ENCOUNTER — Encounter: Payer: Self-pay | Admitting: Neurology

## 2015-10-21 ENCOUNTER — Ambulatory Visit: Payer: Self-pay | Admitting: Endocrinology

## 2015-10-24 ENCOUNTER — Telehealth: Payer: Self-pay | Admitting: Endocrinology

## 2015-10-24 NOTE — Telephone Encounter (Signed)
Alex Curtis, Could you please contact the pt and reschedule. Thanks! 

## 2015-10-24 NOTE — Telephone Encounter (Signed)
Patient no showed today's appt. Please advise on how to follow up. °A. No follow up necessary. °B. Follow up urgent. Contact patient immediately. °C. Follow up necessary. Contact patient and schedule visit in ___ days. °D. Follow up advised. Contact patient and schedule visit in ____weeks. ° °

## 2015-10-24 NOTE — Telephone Encounter (Signed)
Please come in for a new patient appointment in 6 weeks 

## 2015-11-10 ENCOUNTER — Encounter: Payer: Self-pay | Admitting: Neurology

## 2015-11-10 ENCOUNTER — Ambulatory Visit (INDEPENDENT_AMBULATORY_CARE_PROVIDER_SITE_OTHER): Payer: BLUE CROSS/BLUE SHIELD | Admitting: Neurology

## 2015-11-10 VITALS — BP 152/98 | HR 68 | Resp 20 | Ht 68.0 in | Wt 171.0 lb

## 2015-11-10 DIAGNOSIS — G4730 Sleep apnea, unspecified: Secondary | ICD-10-CM

## 2015-11-10 DIAGNOSIS — J45909 Unspecified asthma, uncomplicated: Secondary | ICD-10-CM | POA: Diagnosis not present

## 2015-11-10 DIAGNOSIS — G4710 Hypersomnia, unspecified: Secondary | ICD-10-CM

## 2015-11-10 DIAGNOSIS — R0683 Snoring: Secondary | ICD-10-CM | POA: Diagnosis not present

## 2015-11-10 DIAGNOSIS — J309 Allergic rhinitis, unspecified: Secondary | ICD-10-CM

## 2015-11-10 DIAGNOSIS — R0982 Postnasal drip: Secondary | ICD-10-CM | POA: Diagnosis not present

## 2015-11-10 NOTE — Patient Instructions (Signed)

## 2015-11-10 NOTE — Progress Notes (Signed)
SLEEP MEDICINE CLINIC   Provider:  Larey Seat, M D  Referring Provider: Posey Boyer, MD Primary Care Physician:    Chief Complaint  Patient presents with  . New Patient (Initial Visit)    snores, never had sleep study,rm 10, alone    HPI:  Alex Curtis is a 60 y.o. male of [place] descent , seen here as a referral  from Dr. Linna Darner for a sleep evaluation.   Dear Dr. Linna Darner,   Thank you for allowing me to take care of your patient, Alex Curtis; Alex Curtis reports that over the last year he had a lot of trouble with phlegm and the urge to cough interrupting his sleep. His nights were fragmented by coughing spells, the cough is productive and he felt in daytime more fatigued deprived of sleep. He usually has no trouble going to sleep but maintaining sleep became difficult for him. 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 He remarked that last year his family moved to a normal home and the productive cough continued in spite of the new environment. He also mentions that he had worked or visited [place] and [place] and tinnitus date his cough became much better and even cleared up. In daytime the cough has never been as bothersome he has to clear his throat. He had tried a steroid nasal spray which did not help him at all he had been told by his wife that he snored badly. His past medical history is positive for hyperparathyroidism which is resolved and he is currently no not under major distress. His throat clearing has become a little less frequent over the last month also. He installed in humidifier in his bedroom but it has not given him much relief either. He has also used a purifier with not much effect. He has frequently changes the mattress cover, and now is in the process to purchase a low allergy mattress. He has not had a ENT work up. He disribes a tickling in the back of his throat and post nasal drip.  he completed a prednisone course without success.    Sleep habits are as follows: The patient goes to bed usually around 11 PM, and it will take him not long to fall asleep, he actually reads in bed for about half an hour. He does not struggle to fall asleep. The bedroom is cool, quiet and dark. He sleeps with one pillow, and usually prefers the supine sleep position. This happens to be the position in which he snores the loudest - and his wife is frequently urging him to move. He does not have nocturia, but reports that his coughing spells wake him throughout the night multiple times. The longest he seems to sleep in one block is about 2 hours before his sleep is interrupted by coughing. And this may happen 5-7 times at night. He rises in the morning at 8 AM and he needs to rely on an alarm, he does not feel refreshed or restored and is actually very tired in the morning. His throat is dry.   Sleep medical history and family sleep history:  No history of enuresis, sleep walking or night terrors in childhood. He remembers his father who has past was a snorer. There was no formal evaluation for apnea however.  Social history: Married with children, lifelong nontobacco user, caffeine use one cup of coffee in the morning nor sodas and no ice tea. Alcohol use 1 beer a week. Golfer  Review of Systems: Out of a complete 14 system review, the patient complains of only the following symptoms, and all other reviewed systems are negative. snoring  Epworth score 14 , Fatigue severity score 20 , depression score 0   Social History   Social History  . Marital Status: Married    Spouse Name: N/A  . Number of Children: N/A  . Years of Education: N/A   Occupational History  . Not on file.   Social History Main Topics  . Smoking status: Former Research scientist (life sciences)  . Smokeless tobacco: Never Used  . Alcohol Use: 0.0 oz/week     Comment: 1 glass of wine once a week  . Drug Use: No  . Sexual Activity: Not on file   Other Topics Concern  . Not on file   Social  History Narrative    Family History  Problem Relation Age of Onset  . Hypertension Mother   . Colon cancer Neg Hx   . Esophageal cancer Neg Hx   . Rectal cancer Neg Hx   . Stomach cancer Neg Hx     Past Medical History  Diagnosis Date  . Thyroid disease   . Anxiety   . Hypertension     Past Surgical History  Procedure Laterality Date  . Vasectomy      Current Outpatient Prescriptions  Medication Sig Dispense Refill  . PARoxetine (PAXIL) 20 MG tablet Take 20 mg by mouth every morning.    . propranolol ER (INDERAL LA) 80 MG 24 hr capsule Take 1 capsule (80 mg total) by mouth daily. 90 capsule 1   No current facility-administered medications for this visit.    Allergies as of 11/10/2015  . (No Known Allergies)    Vitals: BP 152/98 mmHg  Pulse 68  Resp 20  Ht 5\' 8"  (1.727 m)  Wt 171 lb (77.565 kg)  BMI 26.01 kg/m2 Last Weight:  Wt Readings from Last 1 Encounters:  11/10/15 171 lb (77.565 kg)   [id number] mass index is 26.01 kg/(m^2).     Last Height:   Ht Readings from Last 1 Encounters:  11/10/15 5\' 8"  (1.727 m)    Physical exam:  General: The patient is awake, alert and appears not in acute distress. The patient is well groomed. Head: Normocephalic, atraumatic. Neck is supple. Mallampati 4   neck circumference: 16.25 . Nasal airflow restricted , TMJ is evident . Retrognathia is not seen.  Cardiovascular:  Regular rate and rhythm , without  murmurs or carotid bruit, and without distended neck veins. Respiratory: Lungs are clear to auscultation. Skin:  Without evidence of edema, or rash Trunk: erect posture  Neurologic exam : The patient is awake and alert, oriented to place and time.   Memory subjective described as intact.  Attention span & concentration ability appears normal.  Speech is fluent, without  dysarthria, dysphonia or aphasia.  Mood and affect are appropriate.  Cranial nerves: Pupils are equal and briskly reactive to light. Funduscopic exam  without  evidence of pallor or edema.  Extraocular movements  in vertical and horizontal planes intact and without nystagmus. Visual fields by finger perimetry are intact. Hearing to finger rub intact.   Facial sensation intact to fine touch.  Facial motor strength is symmetric and tongue and uvula move midline. Shoulder shrug was symmetrical.   Motor exam:   Normal tone, muscle bulk and symmetric strength in all extremities. Sensory:  Fine touch, pinprick and vibration were tested in all extremities. Proprioception tested in the  upper extremities was normal. Coordination: Rapid alternating movements in the fingers/hands was normal. Finger-to-nose maneuver  normal without evidence of ataxia, dysmetria or tremor. Gait and station: Patient walks without assistive device and is able unassisted to climb up to the exam table. Strength within normal limits.  Stance is stable and normal.  Deep tendon reflexes: in the upper and lower extremities are symmetric and intact. Babinski maneuver response is  downgoing.  The patient was advised of the nature of the diagnosed sleep disorder , the treatment options and risks for general a health and wellness arising from not treating the condition.  I spent more than 40 minutes of face to face time with the patient. Greater than 50% of time was spent in counseling and coordination of care. We have discussed the diagnosis and differential and I answered the patient's questions.     Assessment:  After physical and neurologic examination, review of laboratory studies,  Personal review of imaging studies, reports of other /same  Imaging studies ,  Results of polysomnography/ neurophysiology testing and pre-existing records as far as provided in visit., my assessment is   1) Mrs. Settle has reported her husband snores loudly, but this is positional dependent. I'm not sure that he will have apnea but he is certainly not without risk factors.   The frequent fragmentation  of sleep by a tickling in his throat accompanied with a postnasal drip is contributing to the poor quality of sleep, and daytime sleepiness he now reports. As he has not responded to nasal sprays and a prednisone Dosepak I would suggest an evaluation of his sinus cavities. With Dr. Clayborn Heron permission I will sent for a CT sinus and allergy skin tests. His work environment and home have been dust reduced.   2) I will order a split night polysomnography and actually want to see the patient sleep in supine bring the beginning of the study to get a better feel if snoring is correlated with apnea or not. Apnea also is often positional dependent.  3) I am not concerned reviewing his fatigue degree but I acknowledge that he is excessively daytime sleepy endorsing the Epworth score at 14 points.    Plan:  Treatment plan and additional workup : SPLIT night polysomnography.  RV after study with discussion of possible treatment options,  If the sleep study should verify the presence of apnea at over 15 apneas per hour I would usually recommend a treatment by CPAP. Humidified and filtered air may also help to decrease the presumed allergic reactions in the patient's upper airway. For lower frequency of apnea and is unassociated with hypoxia or tachycardia bradycardia , a dental device may suffice. I will further order a CT sinus scan and would like and allergy testing battery to be performed. May be his reaction is not allergic at all but due to a chronic infection.     Asencion Partridge Shon Indelicato MD  11/10/2015   CC: Posey Boyer, [address] [place], [place] [phone]

## 2015-11-16 ENCOUNTER — Ambulatory Visit: Payer: Self-pay | Admitting: Endocrinology

## 2015-11-16 DIAGNOSIS — Z0289 Encounter for other administrative examinations: Secondary | ICD-10-CM

## 2015-11-17 ENCOUNTER — Telehealth: Payer: Self-pay | Admitting: Endocrinology

## 2015-11-17 NOTE — Telephone Encounter (Signed)
Patient no showed today's appt. Please advise on how to follow up. °A. No follow up necessary. °B. Follow up urgent. Contact patient immediately. °C. Follow up necessary. Contact patient and schedule visit in ___ days. °D. Follow up advised. Contact patient and schedule visit in ____weeks. ° °

## 2015-11-17 NOTE — Telephone Encounter (Signed)
No follow up necessary.  

## 2015-11-21 ENCOUNTER — Ambulatory Visit (INDEPENDENT_AMBULATORY_CARE_PROVIDER_SITE_OTHER): Payer: BLUE CROSS/BLUE SHIELD | Admitting: Family Medicine

## 2015-11-21 VITALS — BP 126/80 | HR 54 | Temp 97.9°F | Resp 16 | Ht 68.0 in | Wt 174.6 lb

## 2015-11-21 DIAGNOSIS — I10 Essential (primary) hypertension: Secondary | ICD-10-CM

## 2015-11-21 DIAGNOSIS — G4700 Insomnia, unspecified: Secondary | ICD-10-CM

## 2015-11-21 MED ORDER — AMLODIPINE BESYLATE 5 MG PO TABS: 5.0000 mg | ORAL_TABLET | Freq: Every day | ORAL | Status: DC

## 2015-11-21 MED ORDER — LORAZEPAM 0.5 MG PO TABS: 0.5000 mg | ORAL_TABLET | Freq: Two times a day (BID) | ORAL | Status: DC | PRN

## 2015-11-21 NOTE — Progress Notes (Signed)
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  He has had systolic blood pressures over 150 in past week.  He is taking propranolol, last time was last night.  He has gained 10 lbs. Over the past year.    Objective:  BP 126/80 mmHg  Pulse 54  Temp(Src) 97.9 F (36.6 C) (Oral)  Resp 16  Ht 5\' 8"  (1.727 m)  Wt 174 lb 9.6 oz (79.198 kg)  BMI 26.55 kg/m2  SpO2 98% Heart: reg, no murmur Ext:  No edema Chest: clear to auscultation  Essential hypertension - Plan: amLODipine (NORVASC) 5 MG tablet  Insomnia - Plan: LORazepam (ATIVAN) 0.5 MG tablet  Robyn Haber, MD

## 2015-11-21 NOTE — Patient Instructions (Signed)
Hypertension Hypertension, commonly called high blood pressure, is when the force of blood pumping through your arteries is too strong. Your arteries are the blood vessels that carry blood from your heart throughout your body. A blood pressure reading consists of a higher number over a lower number, such as 110/72. The higher number (systolic) is the pressure inside your arteries when your heart pumps. The lower number (diastolic) is the pressure inside your arteries when your heart relaxes. Ideally you want your blood pressure below 120/80. Hypertension forces your heart to work harder to pump blood. Your arteries may become narrow or stiff. Having untreated or uncontrolled hypertension can cause heart attack, stroke, kidney disease, and other problems. RISK FACTORS Some risk factors for high blood pressure are controllable. Others are not.  Risk factors you cannot control include:   Race. You may be at higher risk if you are African American.  Age. Risk increases with age.  Gender. Men are at higher risk than women before age 45 years. After age 65, women are at higher risk than men. Risk factors you can control include:  Not getting enough exercise or physical activity.  Being overweight.  Getting too much fat, sugar, calories, or salt in your diet.  Drinking too much alcohol. SIGNS AND SYMPTOMS Hypertension does not usually cause signs or symptoms. Extremely high blood pressure (hypertensive crisis) may cause headache, anxiety, shortness of breath, and nosebleed. DIAGNOSIS To check if you have hypertension, your health care provider will measure your blood pressure while you are seated, with your arm held at the level of your heart. It should be measured at least twice using the same arm. Certain conditions can cause a difference in blood pressure between your right and left arms. A blood pressure reading that is higher than normal on one occasion does not mean that you need treatment. If  it is not clear whether you have high blood pressure, you may be asked to return on a different day to have your blood pressure checked again. Or, you may be asked to monitor your blood pressure at home for 1 or more weeks. TREATMENT Treating high blood pressure includes making lifestyle changes and possibly taking medicine. Living a healthy lifestyle can help lower high blood pressure. You may need to change some of your habits. Lifestyle changes may include:  Following the DASH diet. This diet is high in fruits, vegetables, and whole grains. It is low in salt, red meat, and added sugars.  Keep your sodium intake below 2,300 mg per day.  Getting at least 30-45 minutes of aerobic exercise at least 4 times per week.  Losing weight if necessary.  Not smoking.  Limiting alcoholic beverages.  Learning ways to reduce stress. Your health care provider may prescribe medicine if lifestyle changes are not enough to get your blood pressure under control, and if one of the following is true:  You are 18-59 years of age and your systolic blood pressure is above 140.  You are 60 years of age or older, and your systolic blood pressure is above 150.  Your diastolic blood pressure is above 90.  You have diabetes, and your systolic blood pressure is over 140 or your diastolic blood pressure is over 90.  You have kidney disease and your blood pressure is above 140/90.  You have heart disease and your blood pressure is above 140/90. Your personal target blood pressure may vary depending on your medical conditions, your age, and other factors. HOME CARE INSTRUCTIONS    Have your blood pressure rechecked as directed by your health care provider.   Take medicines only as directed by your health care provider. Follow the directions carefully. Blood pressure medicines must be taken as prescribed. The medicine does not work as well when you skip doses. Skipping doses also puts you at risk for  problems.  Do not smoke.   Monitor your blood pressure at home as directed by your health care provider. SEEK MEDICAL CARE IF:   You think you are having a reaction to medicines taken.  You have recurrent headaches or feel dizzy.  You have swelling in your ankles.  You have trouble with your vision. SEEK IMMEDIATE MEDICAL CARE IF:  You develop a severe headache or confusion.  You have unusual weakness, numbness, or feel faint.  You have severe chest or abdominal pain.  You vomit repeatedly.  You have trouble breathing. MAKE SURE YOU:   Understand these instructions.  Will watch your condition.  Will get help right away if you are not doing well or get worse.   This information is not intended to replace advice given to you by your health care provider. Make sure you discuss any questions you have with your health care provider.   Document Released: 07/16/2005 Document Revised: 11/30/2014 Document Reviewed: 05/08/2013 Elsevier Interactive Patient Education 2016 Elsevier Inc.  

## 2015-11-27 ENCOUNTER — Telehealth: Payer: Self-pay

## 2015-11-27 NOTE — Telephone Encounter (Signed)
New blood pressure medication is causing patient to not be able to sleep.   Also, heart rate went from 60 to 100  [phone] (H)

## 2015-11-29 NOTE — Telephone Encounter (Signed)
Left message for pt to call back  °

## 2015-11-29 NOTE — Telephone Encounter (Signed)
This is unusual.  Please have patient return for recheck

## 2015-11-30 ENCOUNTER — Ambulatory Visit (INDEPENDENT_AMBULATORY_CARE_PROVIDER_SITE_OTHER): Payer: BLUE CROSS/BLUE SHIELD | Admitting: Family Medicine

## 2015-11-30 VITALS — BP 128/80 | HR 96 | Temp 98.0°F | Resp 16 | Ht 68.0 in | Wt 173.6 lb

## 2015-11-30 DIAGNOSIS — E0590 Thyrotoxicosis, unspecified without thyrotoxic crisis or storm: Secondary | ICD-10-CM

## 2015-11-30 DIAGNOSIS — G4700 Insomnia, unspecified: Secondary | ICD-10-CM

## 2015-11-30 DIAGNOSIS — I10 Essential (primary) hypertension: Secondary | ICD-10-CM

## 2015-11-30 MED ORDER — LORAZEPAM 0.5 MG PO TABS: 0.5000 mg | ORAL_TABLET | Freq: Every day | ORAL | Status: DC

## 2015-11-30 MED ORDER — METOPROLOL SUCCINATE ER 50 MG PO TB24: 50.0000 mg | ORAL_TABLET | Freq: Every day | ORAL | Status: DC

## 2015-11-30 NOTE — Progress Notes (Signed)
60 yo gentleman who recently came to the office with hypertension and had been on inderal for two years.  There was a h/o mild hyperthyroidism in the past as well.  Because of fatigue, I switched him to amlodipine and he rapidly got worse with respect to sleep, rapid pulse and weakness.  He was [place]  He works as a Theatre manager.  Objective:  BP 128/80 mmHg  Pulse 96  Temp(Src) 98 F (36.7 C) (Oral)  Resp 16  Ht 5\' 8"  (1.727 m)  Wt 173 lb 9.6 oz (78.744 kg)  BMI 26.40 kg/m2  SpO2 98% Chest:  Clear Heart:  Reg, no murmur with rate about 80  Neck:  Supple, no adenopathy.  Assessment:  Marked hyperthyroid type reaction to the elimination of inderal.  Plan:  Insomnia - Plan: LORazepam (ATIVAN) 0.5 MG tablet  Hyperthyroidism - Plan: Thyroid Panel With TSH  Essential hypertension - Plan: metoprolol succinate (TOPROL-XL) 50 MG 24 hr tablet  Robyn Haber, MD

## 2015-11-30 NOTE — Patient Instructions (Addendum)
Please let me know if your rapid heart rate and fatigue continue.  You should be able to sleep better now.

## 2015-11-30 NOTE — Telephone Encounter (Signed)
Pt advised.

## 2015-12-01 LAB — THYROID PANEL WITH TSH
Free Thyroxine Index: 2.9 (ref 1.4–3.8)
T3 Uptake: 27 % (ref 22–35)
T4, Total: 10.7 ug/dL (ref 4.5–12.0)
TSH: 1.66 mIU/L (ref 0.40–4.50)

## 2015-12-02 ENCOUNTER — Other Ambulatory Visit: Payer: Self-pay

## 2015-12-05 ENCOUNTER — Inpatient Hospital Stay: Admission: RE | Admit: 2015-12-05 | Payer: Self-pay | Source: Ambulatory Visit

## 2015-12-11 DIAGNOSIS — F329 Major depressive disorder, single episode, unspecified: Secondary | ICD-10-CM | POA: Insufficient documentation

## 2016-03-14 ENCOUNTER — Telehealth: Payer: Self-pay | Admitting: Neurology

## 2016-03-14 NOTE — Telephone Encounter (Signed)
Debra Please see old referral and resend. Thanks Hinton Dyer.

## 2016-03-14 NOTE — Telephone Encounter (Signed)
Patient is calling to get a referral sent again to Dr. Donneta Romberg. He stated his office never received the one that was previously sent.

## 2016-04-10 ENCOUNTER — Ambulatory Visit: Payer: Self-pay

## 2017-01-07 ENCOUNTER — Encounter: Payer: Self-pay | Admitting: Urgent Care

## 2017-01-07 ENCOUNTER — Ambulatory Visit (INDEPENDENT_AMBULATORY_CARE_PROVIDER_SITE_OTHER): Payer: Self-pay | Admitting: Urgent Care

## 2017-01-07 VITALS — BP 147/88 | HR 96 | Temp 98.0°F | Resp 16 | Ht 68.0 in | Wt 171.4 lb

## 2017-01-07 DIAGNOSIS — I10 Essential (primary) hypertension: Secondary | ICD-10-CM

## 2017-01-07 DIAGNOSIS — R030 Elevated blood-pressure reading, without diagnosis of hypertension: Secondary | ICD-10-CM

## 2017-01-07 MED ORDER — LISINOPRIL 10 MG PO TABS: 10.0000 mg | ORAL_TABLET | Freq: Every day | ORAL | 1 refills | Status: DC

## 2017-01-07 NOTE — Progress Notes (Signed)
   MRN: [id number] DOB: 11/01/1955  Subjective:   Alex Curtis is a 61 y.o. male presenting for follow up on Hypertension.   Currently managed with metoprolol. Patient is checking blood pressure at home, generally 597'C systolic, 90 diastolic. Avoids salt in diet, is exercising. Reports that he has not taken his medications for several days now. Denies dizziness, chronic headache, blurred vision, chest pain, shortness of breath, heart racing, palpitations, nausea, vomiting, abdominal pain, hematuria, lower leg swelling. Denies smoking cigarettes.   Alex Curtis has a current medication list which includes the following prescription(s): metoprolol succinate and paroxetine. Also has No Known Allergies. Alex Curtis  has a past medical history of Anxiety; Hypertension; and Thyroid disease. Also  has a past surgical history that includes Vasectomy.  Objective:   Vitals: BP (!) 147/88   Pulse 96   Temp 98 F (36.7 C) (Oral)   Resp 16   Ht 5\' 8"  (1.727 m)   Wt 171 lb 6.4 oz (77.7 kg)   SpO2 99%   BMI 26.06 kg/m   BP Readings from Last 3 Encounters:  01/07/17 (!) 147/88  11/30/15 128/80  11/21/15 126/80    Physical Exam  Constitutional: He is oriented to person, place, and time. He appears well-developed and well-nourished.  HENT:  Mouth/Throat: Oropharynx is clear and moist.  Eyes: No scleral icterus.  Cardiovascular: Normal rate, regular rhythm and intact distal pulses.  Exam reveals no gallop and no friction rub.   No murmur heard. Pulmonary/Chest: No respiratory distress. He has no wheezes. He has no rales.  Musculoskeletal: He exhibits no edema.  Neurological: He is alert and oriented to person, place, and time.  Skin: Skin is warm and dry.  Psychiatric: He has a normal mood and affect.    Assessment and Plan :   1. Essential hypertension 2. Elevated blood pressure reading - Patient declined lab work today. We will switch from metoprolol to lisinopril. Recheck in 4 weeks, will plan to  obtain labs then.  Jaynee Eagles, PA-C [facility] [phone] 01/07/2017  11:12 AM

## 2017-01-07 NOTE — Patient Instructions (Addendum)
Hypertension Hypertension, commonly called high blood pressure, is when the force of blood pumping through the arteries is too strong. The arteries are the blood vessels that carry blood from the heart throughout the body. Hypertension forces the heart to work harder to pump blood and may cause arteries to become narrow or stiff. Having untreated or uncontrolled hypertension can cause heart attacks, strokes, kidney disease, and other problems. A blood pressure reading consists of a higher number over a lower number. Ideally, your blood pressure should be below 120/80. The first ("top") number is called the systolic pressure. It is a measure of the pressure in your arteries as your heart beats. The second ("bottom") number is called the diastolic pressure. It is a measure of the pressure in your arteries as the heart relaxes. What are the causes? The cause of this condition is not known. What increases the risk? Some risk factors for high blood pressure are under your control. Others are not. Factors you can change  Smoking.  Having type 2 diabetes mellitus, high cholesterol, or both.  Not getting enough exercise or physical activity.  Being overweight.  Having too much fat, sugar, calories, or salt (sodium) in your diet.  Drinking too much alcohol. Factors that are difficult or impossible to change  Having chronic kidney disease.  Having a family history of high blood pressure.  Age. Risk increases with age.  Race. You may be at higher risk if you are African-American.  Gender. Men are at higher risk than women before age 45. After age 65, women are at higher risk than men.  Having obstructive sleep apnea.  Stress. What are the signs or symptoms? Extremely high blood pressure (hypertensive crisis) may cause:  Headache.  Anxiety.  Shortness of breath.  Nosebleed.  Nausea and vomiting.  Severe chest pain.  Jerky movements you cannot control (seizures).  How is this  diagnosed? This condition is diagnosed by measuring your blood pressure while you are seated, with your arm resting on a surface. The cuff of the blood pressure monitor will be placed directly against the skin of your upper arm at the level of your heart. It should be measured at least twice using the same arm. Certain conditions can cause a difference in blood pressure between your right and left arms. Certain factors can cause blood pressure readings to be lower or higher than normal (elevated) for a short period of time:  When your blood pressure is higher when you are in a health care provider's office than when you are at home, this is called white coat hypertension. Most people with this condition do not need medicines.  When your blood pressure is higher at home than when you are in a health care provider's office, this is called masked hypertension. Most people with this condition may need medicines to control blood pressure.  If you have a high blood pressure reading during one visit or you have normal blood pressure with other risk factors:  You may be asked to return on a different day to have your blood pressure checked again.  You may be asked to monitor your blood pressure at home for 1 week or longer.  If you are diagnosed with hypertension, you may have other blood or imaging tests to help your health care provider understand your overall risk for other conditions. How is this treated? This condition is treated by making healthy lifestyle changes, such as eating healthy foods, exercising more, and reducing your alcohol intake. Your   health care provider may prescribe medicine if lifestyle changes are not enough to get your blood pressure under control, and if:  Your systolic blood pressure is above 130.  Your diastolic blood pressure is above 80.  Your personal target blood pressure may vary depending on your medical conditions, your age, and other factors. Follow these  instructions at home: Eating and drinking  Eat a diet that is high in fiber and potassium, and low in sodium, added sugar, and fat. An example eating plan is called the DASH (Dietary Approaches to Stop Hypertension) diet. To eat this way: ? Eat plenty of fresh fruits and vegetables. Try to fill half of your plate at each meal with fruits and vegetables. ? Eat whole grains, such as whole wheat pasta, brown rice, or whole grain bread. Fill about one quarter of your plate with whole grains. ? Eat or drink low-fat dairy products, such as skim milk or low-fat yogurt. ? Avoid fatty cuts of meat, processed or cured meats, and poultry with skin. Fill about one quarter of your plate with lean proteins, such as fish, chicken without skin, beans, eggs, and tofu. ? Avoid premade and processed foods. These tend to be higher in sodium, added sugar, and fat.  Reduce your daily sodium intake. Most people with hypertension should eat less than 1,500 mg of sodium a day.  Limit alcohol intake to no more than 1 drink a day for nonpregnant women and 2 drinks a day for men. One drink equals 12 oz of beer, 5 oz of wine, or 1 oz of hard liquor. Lifestyle  Work with your health care provider to maintain a healthy body weight or to lose weight. Ask what an ideal weight is for you.  Get at least 30 minutes of exercise that causes your heart to beat faster (aerobic exercise) most days of the week. Activities may include walking, swimming, or biking.  Include exercise to strengthen your muscles (resistance exercise), such as pilates or lifting weights, as part of your weekly exercise routine. Try to do these types of exercises for 30 minutes at least 3 days a week.  Do not use any products that contain nicotine or tobacco, such as cigarettes and e-cigarettes. If you need help quitting, ask your health care provider.  Monitor your blood pressure at home as told by your health care provider.  Keep all follow-up visits as  told by your health care provider. This is important. Medicines  Take over-the-counter and prescription medicines only as told by your health care provider. Follow directions carefully. Blood pressure medicines must be taken as prescribed.  Do not skip doses of blood pressure medicine. Doing this puts you at risk for problems and can make the medicine less effective.  Ask your health care provider about side effects or reactions to medicines that you should watch for. Contact a health care provider if:  You think you are having a reaction to a medicine you are taking.  You have headaches that keep coming back (recurring).  You feel dizzy.  You have swelling in your ankles.  You have trouble with your vision. Get help right away if:  You develop a severe headache or confusion.  You have unusual weakness or numbness.  You feel faint.  You have severe pain in your chest or abdomen.  You vomit repeatedly.  You have trouble breathing. Summary  Hypertension is when the force of blood pumping through your arteries is too strong. If this condition is not   controlled, it may put you at risk for serious complications.  Your personal target blood pressure may vary depending on your medical conditions, your age, and other factors. For most people, a normal blood pressure is less than 120/80.  Hypertension is treated with lifestyle changes, medicines, or a combination of both. Lifestyle changes include weight loss, eating a healthy, low-sodium diet, exercising more, and limiting alcohol. This information is not intended to replace advice given to you by your health care provider. Make sure you discuss any questions you have with your health care provider. Document Released: 07/16/2005 Document Revised: 06/13/2016 Document Reviewed: 06/13/2016 Elsevier Interactive Patient Education  2018 [employer].    Lisinopril tablets What is this medicine? LISINOPRIL (lyse IN oh pril) is an ACE  inhibitor. This medicine is used to treat high blood pressure and heart failure. It is also used to protect the heart immediately after a heart attack. This medicine may be used for other purposes; ask your health care provider or pharmacist if you have questions. COMMON BRAND NAME(S): Prinivil, Zestril What should I tell my health care provider before I take this medicine? They need to know if you have any of these conditions: -diabetes -heart or blood vessel disease -kidney disease -low blood pressure -previous swelling of the tongue, face, or lips with difficulty breathing, difficulty swallowing, hoarseness, or tightening of the throat -an unusual or allergic reaction to lisinopril, other ACE inhibitors, insect venom, foods, dyes, or preservatives -pregnant or trying to get pregnant -breast-feeding How should I use this medicine? Take this medicine by mouth with a glass of water. Follow the directions on your prescription label. You may take this medicine with or without food. If it upsets your stomach, take it with food. Take your medicine at regular intervals. Do not take it more often than directed. Do not stop taking except on your doctor's advice. Talk to your pediatrician regarding the use of this medicine in children. Special care may be needed. While this drug may be prescribed for children as young as 62 years of age for selected conditions, precautions do apply. Overdosage: If you think you have taken too much of this medicine contact a poison control center or emergency room at once. NOTE: This medicine is only for you. Do not share this medicine with others. What if I miss a dose? If you miss a dose, take it as soon as you can. If it is almost time for your next dose, take only that dose. Do not take double or extra doses. What may interact with this medicine? Do not take this medicine with any of the following medications: -hymenoptera venom -sacubitril; valsartan This  medicines may also interact with the following medications: -aliskiren -angiotensin receptor blockers, like losartan or valsartan -certain medicines for diabetes -diuretics -everolimus -gold compounds -lithium -NSAIDs, medicines for pain and inflammation, like ibuprofen or naproxen -potassium salts or supplements -salt substitutes -sirolimus -temsirolimus This list may not describe all possible interactions. Give your health care provider a list of all the medicines, herbs, non-prescription drugs, or dietary supplements you use. Also tell them if you smoke, drink alcohol, or use illegal drugs. Some items may interact with your medicine. What should I watch for while using this medicine? Visit your doctor or health care professional for regular check ups. Check your blood pressure as directed. Ask your doctor what your blood pressure should be, and when you should contact him or her. Do not treat yourself for coughs, colds, or pain while you  are using this medicine without asking your doctor or health care professional for advice. Some ingredients may increase your blood pressure. Women should inform their doctor if they wish to become pregnant or think they might be pregnant. There is a potential for serious side effects to an unborn child. Talk to your health care professional or pharmacist for more information. Check with your doctor or health care professional if you get an attack of severe diarrhea, nausea and vomiting, or if you sweat a lot. The loss of too much body fluid can make it dangerous for you to take this medicine. You may get drowsy or dizzy. Do not drive, use machinery, or do anything that needs mental alertness until you know how this drug affects you. Do not stand or sit up quickly, especially if you are an older patient. This reduces the risk of dizzy or fainting spells. Alcohol can make you more drowsy and dizzy. Avoid alcoholic drinks. Avoid salt substitutes unless you are  told otherwise by your doctor or health care professional. What side effects may I notice from receiving this medicine? Side effects that you should report to your doctor or health care professional as soon as possible: -allergic reactions like skin rash, itching or hives, swelling of the hands, feet, face, lips, throat, or tongue -breathing problems -signs and symptoms of kidney injury like trouble passing urine or change in the amount of urine -signs and symptoms of increased potassium like muscle weakness; chest pain; or fast, irregular heartbeat -signs and symptoms of liver injury like dark yellow or brown urine; general ill feeling or flu-like symptoms; light-colored stools; loss of appetite; nausea; right upper belly pain; unusually weak or tired; yellowing of the eyes or skin -signs and symptoms of low blood pressure like dizziness; feeling faint or lightheaded, falls; unusually weak or tired -stomach pain with or without nausea and vomiting Side effects that usually do not require medical attention (report to your doctor or health care professional if they continue or are bothersome): -changes in taste -cough -dizziness -fever -headache -sensitivity to light This list may not describe all possible side effects. Call your doctor for medical advice about side effects. You may report side effects to FDA at 1-800-FDA-1088. Where should I keep my medicine? Keep out of the reach of children. Store at room temperature between 15 and 30 degrees C (59 and 86 degrees F). Protect from moisture. Keep container tightly closed. Throw away any unused medicine after the expiration date. NOTE: This sheet is a summary. It may not cover all possible information. If you have questions about this medicine, talk to your doctor, pharmacist, or health care provider.  2018 Elsevier/Gold Standard (2015-09-05 12:52:35)     IF you received an x-ray today, you will receive an invoice from [facility]. Please contact [facility] at [phone] with questions or concerns regarding your invoice.   IF you received labwork today, you will receive an invoice from [facility]. Please contact [facility] at [phone] with questions or concerns regarding your invoice.   Our billing staff will not be able to assist you with questions regarding bills from these companies.  You will be contacted with the lab results as soon as they are available. The fastest way to get your results is to activate your My Chart account. Instructions are located on the last page of this paperwork. If you have not heard from [place] regarding the results in 2 weeks, please contact this office.

## 2017-01-08 LAB — MICROALBUMIN / CREATININE URINE RATIO
Creatinine, Urine: 286.3 mg/dL
MICROALBUM., U, RANDOM: 13.5 ug/mL
Microalb/Creat Ratio: 4.7 mg/g creat (ref 0.0–30.0)

## 2017-01-31 ENCOUNTER — Encounter: Payer: Self-pay | Admitting: Physician Assistant

## 2017-01-31 NOTE — Progress Notes (Deleted)
PRIMARY CARE AT [facility] [address] [place] [address] [phone]  Date:  01/31/2017   Name:  Alex Curtis   DOB:  [birth year]   MRN:  [id number]  PCP:  Elizabeth Palau, MD (Inactive)    History of Present Illness:  Alex Curtis is a 61 y.o. male patient with hx of hyperthyroidism. who presents to PCP with  Chief Complaint  Patient presents with  . uncomfortable feeling on left side of chest    weird sensation feels like a warm sensation that patient has never felt.  Pt attributes it to lisinopril medication, Onset: 3 days ago and bp was 150/100 and was high on yesterday as well.     Patient returns here, last seen 3 weeks ago.  He reports that he has  He currently is taking lisinopril.  In the past few days, he has had noticed his blood pressure elevated, and 150/100.  Feels like a warm sensation,  Without pain.  No sob.  Intermittently.  Feels more when he is awake.  He went to the golf range, and noticed that he needed to sit, thinking it was possibly the heat.  Juice/water--1:1 4 bottles per day.  He is a non-smoker.  He was taking methimazole, but stopped, after going on a strict diet.  Labs were checked and normal.  Patient reports a warm sensation over the left side of chest.    Patient Active Problem List   Diagnosis Date Noted  . Essential hypertension 11/21/2015    Past Medical History:  Diagnosis Date  . Anxiety   . Hypertension   . Thyroid disease     Past Surgical History:  Procedure Laterality Date  . VASECTOMY      Social History  Substance Use Topics  . Smoking status: Former Research scientist (life sciences)  . Smokeless tobacco: Never Used  . Alcohol use 0.0 oz/week     Comment: 1 glass of wine once a week    Family History  Problem Relation Age of Onset  . Hypertension Mother   . Colon cancer Neg Hx   . Esophageal cancer Neg Hx   . Rectal cancer Neg Hx   . Stomach cancer Neg Hx     No Known Allergies  Medication list has been reviewed and  updated.  Current Outpatient Prescriptions on File Prior to Visit  Medication Sig Dispense Refill  . lisinopril (PRINIVIL,ZESTRIL) 10 MG tablet Take 1 tablet (10 mg total) by mouth daily. 90 tablet 1  . metoprolol succinate (TOPROL-XL) 50 MG 24 hr tablet Take 1 tablet (50 mg total) by mouth daily. Take with or immediately following a meal. 90 tablet 3  . PARoxetine (PAXIL) 20 MG tablet Take 20 mg by mouth every morning.     No current facility-administered medications on file prior to visit.     ROS ROS otherwise unremarkable unless listed above.  Physical Examination: There were no vitals taken for this visit. Ideal Body Weight:    Physical Exam   Assessment and Plan: Alex Curtis is a 61 y.o. male who is here today  There are no diagnoses linked to this encounter.  Ivar Drape, PA-C [facility] 01/31/2017 4:46 PM

## 2017-01-31 NOTE — Patient Instructions (Signed)
     IF you received an x-ray today, you will receive an invoice from [facility]. Please contact [facility] at [phone] with questions or concerns regarding your invoice.   IF you received labwork today, you will receive an invoice from [facility]. Please contact [facility] at [phone] with questions or concerns regarding your invoice.   Our billing staff will not be able to assist you with questions regarding bills from these companies.  You will be contacted with the lab results as soon as they are available. The fastest way to get your results is to activate your My Chart account. Instructions are located on the last page of this paperwork. If you have not heard from us regarding the results in 2 weeks, please contact this office.     

## 2017-02-01 ENCOUNTER — Encounter: Payer: Self-pay | Admitting: Physician Assistant

## 2017-02-01 ENCOUNTER — Ambulatory Visit (INDEPENDENT_AMBULATORY_CARE_PROVIDER_SITE_OTHER): Payer: Self-pay | Admitting: Physician Assistant

## 2017-02-01 VITALS — BP 141/91 | HR 76 | Temp 98.6°F | Resp 16 | Ht 68.0 in | Wt 171.8 lb

## 2017-02-01 DIAGNOSIS — R079 Chest pain, unspecified: Secondary | ICD-10-CM

## 2017-02-01 LAB — TROPONIN I

## 2017-02-01 MED ORDER — AMLODIPINE BESYLATE 5 MG PO TABS: 5.0000 mg | ORAL_TABLET | Freq: Every day | ORAL | 3 refills | Status: DC

## 2017-02-01 NOTE — Progress Notes (Signed)
02/01/2017 5:02 PM   DOB: 02/12/1956 / MRN: [id number]  SUBJECTIVE:  Alex Curtis is a 61 y.o. male presenting for an EKG. Was seen yesterday for chest pain but per PA Alex Curtis patient was not billed as he could not afford the EKG, thus he is here today for the full OV.   States that there is a warmth in the left side of his chest that started about 3 days ago.  It is not getting worse or better.  Thinks this may be due to his new BP medication and was taking metoprolol before and now is on a low dose of lisinopril. Associates new SOB and DOE. First noticed this while hitting some golf balls at the driving range.  Denies nausea, diaphoresis, leg swelling, diaphoresis, dizziness.    Has a history of hyperthyroidism and this was treated with methimazole and he supposedly completed treatment.   Denies a family history of CAD. He is a former smoker with a 3 pack year history and quit 30 years. Has been treated for HTN with metoprolol for about three years.     He has No Known Allergies.   He  has a past medical history of Anxiety; Hypertension; and Thyroid disease.    He  reports that he has quit smoking. He has never used smokeless tobacco. He reports that he drinks alcohol. He reports that he does not use drugs. He  has no sexual activity history on file. The patient  has a past surgical history that includes Vasectomy.  His family history includes Hypertension in his mother.  Review of Systems  Constitutional: Negative for chills, diaphoresis and fever.  Respiratory: Negative for cough, hemoptysis, sputum production, shortness of breath and wheezing.   Cardiovascular: Negative for chest pain, orthopnea and leg swelling.  Gastrointestinal: Negative for abdominal pain, blood in stool, constipation, diarrhea, heartburn, melena, nausea and vomiting.  Genitourinary: Negative for flank pain.  Skin: Negative for rash.  Neurological: Negative for dizziness.    The problem list and medications  were reviewed and updated by myself where necessary and exist elsewhere in the encounter.   OBJECTIVE:  BP (!) 141/91 (BP Location: Right Arm, Patient Position: Sitting, Cuff Size: Normal)   Pulse 76   Temp 98.6 F (37 C) (Oral)   Resp 16   Ht 5\' 8"  (1.727 m)   Wt 171 lb 12.8 oz (77.9 kg)   SpO2 97%   BMI 26.12 kg/m   BP Readings from Last 3 Encounters:  02/01/17 (!) 141/91  01/31/17 133/84  01/07/17 (!) 147/88     Physical Exam  Constitutional: He appears well-developed. He is active and cooperative.  Non-toxic appearance.  Cardiovascular: Normal rate, regular rhythm, S1 normal, S2 normal, normal heart sounds, intact distal pulses and normal pulses.  Exam reveals no gallop and no friction rub.   No murmur heard. Pulmonary/Chest: Effort normal. No stridor. No tachypnea. No respiratory distress. He has no wheezes. He has no rales.  Abdominal: Soft. Normal appearance and bowel sounds are normal. He exhibits no distension and no mass. There is no tenderness. There is no rigidity, no rebound, no guarding and no CVA tenderness. No hernia.  Musculoskeletal: He exhibits no edema.  Neurological: He is alert.  Skin: Skin is warm and dry. He is not diaphoretic. No pallor.  Psychiatric: His speech is normal and behavior is normal. Judgment and thought content normal. His mood appears anxious. Cognition and memory are normal.  Vitals reviewed.  EKG: NSR,  normal axis.  Negative for hypertrophy, ischemia, infarction.    No results found for this or any previous visit (from the past 72 hour(s)).  No results found.  ASSESSMENT AND PLAN:  Alex Curtis was seen today for chest pain.  Diagnoses and all orders for this visit:  Chest pain, unspecified type: With a normal EKG and his lack of risk factors even a mildly positive trop would be low risk per Heart Score.  He needs labs but refuses.  I have spoken with Dr. Tamala Julian and since he refuses a renal panel he is a better candidate for a CCB.   Patient is amenable to this and will start Norvasc tomorrow.    -     EKG 12-Lead -     Cancel: Troponin I -     TSH -     Troponin I    The patient is advised to call or return to clinic if he does not see an improvement in symptoms, or to seek the care of the closest emergency department if he worsens with the above plan.   Alex Curtis, MHS, PA-C Primary Care at [facility] 02/01/2017 5:02 PM

## 2017-02-01 NOTE — Patient Instructions (Addendum)
  If I do not call you by 10 pm then you are not having a heart attack.   Double you BP medication so that you are taking 20 mg of Lisinopril.     IF you received an x-ray today, you will receive an invoice from [facility]. Please contact [facility] at [phone] with questions or concerns regarding your invoice.   IF you received labwork today, you will receive an invoice from [facility]. Please contact [facility] at [phone] with questions or concerns regarding your invoice.   Our billing staff will not be able to assist you with questions regarding bills from these companies.  You will be contacted with the lab results as soon as they are available. The fastest way to get your results is to activate your My Chart account. Instructions are located on the last page of this paperwork. If you have not heard from [place] regarding the results in 2 weeks, please contact this office.

## 2017-02-01 NOTE — Progress Notes (Signed)
Patient refuses full work up today, due to cost Warned of alarming sxs to warrant immediate need to hospital.   He returned the next day for full work up.

## 2017-02-02 LAB — TSH: TSH: 2.37 u[IU]/mL (ref 0.450–4.500)

## 2017-02-02 LAB — TROPONIN I

## 2017-02-07 ENCOUNTER — Ambulatory Visit (INDEPENDENT_AMBULATORY_CARE_PROVIDER_SITE_OTHER): Payer: Self-pay | Admitting: Urgent Care

## 2017-02-07 ENCOUNTER — Encounter: Payer: Self-pay | Admitting: Urgent Care

## 2017-02-07 VITALS — BP 138/95 | HR 80 | Temp 97.9°F | Resp 16 | Ht 68.0 in | Wt 170.8 lb

## 2017-02-07 DIAGNOSIS — J0100 Acute maxillary sinusitis, unspecified: Secondary | ICD-10-CM

## 2017-02-07 DIAGNOSIS — J3089 Other allergic rhinitis: Secondary | ICD-10-CM

## 2017-02-07 DIAGNOSIS — R059 Cough, unspecified: Secondary | ICD-10-CM

## 2017-02-07 DIAGNOSIS — I10 Essential (primary) hypertension: Secondary | ICD-10-CM

## 2017-02-07 DIAGNOSIS — R05 Cough: Secondary | ICD-10-CM

## 2017-02-07 MED ORDER — AMOXICILLIN-POT CLAVULANATE 500-125 MG PO TABS: 1.0000 | ORAL_TABLET | Freq: Three times a day (TID) | ORAL | 0 refills | Status: DC

## 2017-02-07 MED ORDER — CETIRIZINE HCL 10 MG PO TABS: 10.0000 mg | ORAL_TABLET | Freq: Every day | ORAL | 11 refills | Status: DC

## 2017-02-07 MED ORDER — HYDROCODONE-HOMATROPINE 5-1.5 MG/5ML PO SYRP: 5.0000 mL | ORAL_SOLUTION | Freq: Every evening | ORAL | 0 refills | Status: DC | PRN

## 2017-02-07 NOTE — Progress Notes (Signed)
    MRN: [id number] DOB: 02-15-1956  Subjective:   Alex Curtis is a 61 y.o. male presenting for follow up on HTN, bloodwork. Has felt heart racing in the past month. Heart racing happens randomly, can occur at rest. Denies dizziness, chronic headache, chest pain, shortness of breath, palpitations, nausea, vomiting, abdominal pain, hematuria, lower leg swelling.   Reports 4 day history of sinus congestion, productive cough, body aches, sore throat. Cough elicits chest pain and shob, worst at night. Has tried otc Tussin without any relief.  Admits having had sinus issues and allergies for the past year. He has used nasal steroid consistently. Does not take any allergy medications. Denies smoking cigarettes.   Alex Curtis has a current medication list which includes the following prescription(s): amlodipine, paroxetine, lisinopril, and metoprolol succinate. Also has No Known Allergies. Alex Curtis  has a past medical history of Anxiety; Hypertension; and Thyroid disease. Also  has a past surgical history that includes Vasectomy.  Objective:   Vitals: BP (!) 138/95   Pulse 80   Temp 97.9 F (36.6 C) (Oral)   Resp 16   Ht 5\' 8"  (1.727 m)   Wt 170 lb 12.8 oz (77.5 kg)   SpO2 97%   BMI 25.97 kg/m   BP Readings from Last 3 Encounters:  02/07/17 (!) 138/95  02/01/17 (!) 141/91  01/31/17 133/84   Physical Exam  Constitutional: He is oriented to person, place, and time. He appears well-developed and well-nourished.  HENT:  TM's intact bilaterally, no effusions or erythema. Nasal turbinates erythematous, nasal passages patent. Left-sided maxillary sinus tenderness. Oropharynx without exudates or tonsillar erythema, mucous membranes moist.  Eyes: Right eye exhibits no discharge. Left eye exhibits no discharge. No scleral icterus.  Neck: Normal range of motion. Neck supple.  Cardiovascular: Normal rate, regular rhythm and intact distal pulses.  Exam reveals no gallop and no friction rub.   No murmur  heard. Pulmonary/Chest: No respiratory distress. He has no wheezes. He has no rales.  Abdominal: Soft. Bowel sounds are normal. He exhibits no distension and no mass. There is no tenderness. There is no guarding.  Musculoskeletal: He exhibits no edema.  Lymphadenopathy:    He has no cervical adenopathy.  Neurological: He is alert and oriented to person, place, and time.  Skin: Skin is warm and dry.  Psychiatric: He has a normal mood and affect.   Assessment and Plan :   1. Acute non-recurrent maxillary sinusitis 2. Cough - Start Augmentin to address sinusitis secondary to chronic allergic rhinitis. Use hycodan for cough suppression.  - If no improvement or symptoms do not resolve return to clinic in 1 week.  3. Allergic rhinitis due to other allergic trigger, unspecified seasonality - Hold off on nasal steroid until sinusitis resolves. - cetirizine (ZYRTEC) 10 MG tablet; Take 1 tablet (10 mg total) by mouth daily.  Dispense: 30 tablet; Refill: 11  4. Essential hypertension - Increase amlodipine to 10mg  daily. Recheck in 4 weeks. - Comprehensive metabolic panel   Jaynee Eagles, PA-C [facility] [phone] 02/07/2017 11:30 AM

## 2017-02-07 NOTE — Patient Instructions (Addendum)
Please hold off on your nasal steroid spray until your sinus infection is better. Take Zyrtec daily to address allergies. Make sure you stay well hydrated with at least 2 liters of water a day.    Allergic Rhinitis Allergic rhinitis is when the mucous membranes in the nose respond to allergens. Allergens are particles in the air that cause your body to have an allergic reaction. This causes you to release allergic antibodies. Through a chain of events, these eventually cause you to release histamine into the blood stream. Although meant to protect the body, it is this release of histamine that causes your discomfort, such as frequent sneezing, congestion, and an itchy, runny nose. What are the causes? Seasonal allergic rhinitis (hay fever) is caused by pollen allergens that may come from grasses, trees, and weeds. Year-round allergic rhinitis (perennial allergic rhinitis) is caused by allergens such as house dust mites, pet dander, and mold spores. What are the signs or symptoms?  Nasal stuffiness (congestion).  Itchy, runny nose with sneezing and tearing of the eyes. How is this diagnosed? Your health care provider can help you determine the allergen or allergens that trigger your symptoms. If you and your health care provider are unable to determine the allergen, skin or blood testing may be used. Your health care provider will diagnose your condition after taking your health history and performing a physical exam. Your health care provider may assess you for other related conditions, such as asthma, pink eye, or an ear infection. How is this treated? Allergic rhinitis does not have a cure, but it can be controlled by:  Medicines that block allergy symptoms. These may include allergy shots, nasal sprays, and oral antihistamines.  Avoiding the allergen.  Hay fever may often be treated with antihistamines in pill or nasal spray forms. Antihistamines block the effects of histamine. There are  over-the-counter medicines that may help with nasal congestion and swelling around the eyes. Check with your health care provider before taking or giving this medicine. If avoiding the allergen or the medicine prescribed do not work, there are many new medicines your health care provider can prescribe. Stronger medicine may be used if initial measures are ineffective. Desensitizing injections can be used if medicine and avoidance does not work. Desensitization is when a patient is given ongoing shots until the body becomes less sensitive to the allergen. Make sure you follow up with your health care provider if problems continue. Follow these instructions at home: It is not possible to completely avoid allergens, but you can reduce your symptoms by taking steps to limit your exposure to them. It helps to know exactly what you are allergic to so that you can avoid your specific triggers. Contact a health care provider if:  You have a fever.  You develop a cough that does not stop easily (persistent).  You have shortness of breath.  You start wheezing.  Symptoms interfere with normal daily activities. This information is not intended to replace advice given to you by your health care provider. Make sure you discuss any questions you have with your health care provider. Document Released: 04/10/2001 Document Revised: 03/16/2016 Document Reviewed: 03/23/2013 Elsevier Interactive Patient Education  2017 [employer]   Cough, Adult Coughing is a reflex that clears your throat and your airways. Coughing helps to heal and protect your lungs. It is normal to cough occasionally, but a cough that happens with other symptoms or lasts a long time may be a sign of a condition that  needs treatment. A cough may last only 2-3 weeks (acute), or it may last longer than 8 weeks (chronic). What are the causes? Coughing is commonly caused by:  Breathing in substances that irritate your lungs.  A viral or  bacterial respiratory infection.  Allergies.  Asthma.  Postnasal drip.  Smoking.  Acid backing up from the stomach into the esophagus (gastroesophageal reflux).  Certain medicines.  Chronic lung problems, including COPD (or rarely, lung cancer).  Other medical conditions such as heart failure.  Follow these instructions at home: Pay attention to any changes in your symptoms. Take these actions to help with your discomfort:  Take medicines only as told by your health care provider. ? If you were prescribed an antibiotic medicine, take it as told by your health care provider. Do not stop taking the antibiotic even if you start to feel better. ? Talk with your health care provider before you take a cough suppressant medicine.  Drink enough fluid to keep your urine clear or pale yellow.  If the air is dry, use a cold steam vaporizer or humidifier in your bedroom or your home to help loosen secretions.  Avoid anything that causes you to cough at work or at home.  If your cough is worse at night, try sleeping in a semi-upright position.  Avoid cigarette smoke. If you smoke, quit smoking. If you need help quitting, ask your health care provider.  Avoid caffeine.  Avoid alcohol.  Rest as needed.  Contact a health care provider if:  You have new symptoms.  You cough up pus.  Your cough does not get better after 2-3 weeks, or your cough gets worse.  You cannot control your cough with suppressant medicines and you are losing sleep.  You develop pain that is getting worse or pain that is not controlled with pain medicines.  You have a fever.  You have unexplained weight loss.  You have night sweats. Get help right away if:  You cough up blood.  You have difficulty breathing.  Your heartbeat is very fast. This information is not intended to replace advice given to you by your health care provider. Make sure you discuss any questions you have with your health care  provider. Document Released: 01/12/2011 Document Revised: 12/22/2015 Document Reviewed: 09/22/2014 Elsevier Interactive Patient Education  2017 [employer].    IF you received an x-ray today, you will receive an invoice from [facility]. Please contact [facility] at [phone] with questions or concerns regarding your invoice.   IF you received labwork today, you will receive an invoice from [place]. Please contact [facility] at [phone] with questions or concerns regarding your invoice.   Our billing staff will not be able to assist you with questions regarding bills from these companies.  You will be contacted with the lab results as soon as they are available. The fastest way to get your results is to activate your My Chart account. Instructions are located on the last page of this paperwork. If you have not heard from [place] regarding the results in 2 weeks, please contact this office.

## 2017-02-08 LAB — COMPREHENSIVE METABOLIC PANEL
A/G RATIO: 2.2 (ref 1.2–2.2)
ALT: 11 IU/L (ref 0–44)
AST: 14 IU/L (ref 0–40)
Albumin: 4.4 g/dL (ref 3.5–5.5)
Alkaline Phosphatase: 65 IU/L (ref 39–117)
BUN/Creatinine Ratio: 14 (ref 9–20)
BUN: 15 mg/dL (ref 6–24)
Bilirubin Total: 0.5 mg/dL (ref 0.0–1.2)
CALCIUM: 8.7 mg/dL (ref 8.7–10.2)
CO2: 23 mmol/L (ref 20–29)
Chloride: 103 mmol/L (ref 96–106)
Creatinine, Ser: 1.04 mg/dL (ref 0.76–1.27)
GFR calc Af Amer: 90 mL/min/{1.73_m2} (ref >59)
GFR, EST NON AFRICAN AMERICAN: 78 mL/min/{1.73_m2} (ref >59)
GLUCOSE: 87 mg/dL (ref 65–99)
Globulin, Total: 2 g/dL (ref 1.5–4.5)
POTASSIUM: 4 mmol/L (ref 3.5–5.2)
Sodium: 141 mmol/L (ref 134–144)
Total Protein: 6.4 g/dL (ref 6.0–8.5)

## 2017-04-03 ENCOUNTER — Ambulatory Visit (INDEPENDENT_AMBULATORY_CARE_PROVIDER_SITE_OTHER): Payer: Self-pay | Admitting: Urgent Care

## 2017-04-03 ENCOUNTER — Encounter: Payer: Self-pay | Admitting: Urgent Care

## 2017-04-03 VITALS — BP 139/93 | HR 101 | Temp 97.3°F | Resp 18 | Ht 68.0 in | Wt 172.0 lb

## 2017-04-03 DIAGNOSIS — R000 Tachycardia, unspecified: Secondary | ICD-10-CM

## 2017-04-03 DIAGNOSIS — R519 Headache, unspecified: Secondary | ICD-10-CM

## 2017-04-03 DIAGNOSIS — H6981 Other specified disorders of Eustachian tube, right ear: Secondary | ICD-10-CM

## 2017-04-03 DIAGNOSIS — Z9109 Other allergy status, other than to drugs and biological substances: Secondary | ICD-10-CM

## 2017-04-03 DIAGNOSIS — I10 Essential (primary) hypertension: Secondary | ICD-10-CM

## 2017-04-03 DIAGNOSIS — R51 Headache: Secondary | ICD-10-CM

## 2017-04-03 MED ORDER — METOPROLOL SUCCINATE ER 50 MG PO TB24: 50.0000 mg | ORAL_TABLET | Freq: Every day | ORAL | 1 refills | Status: DC

## 2017-04-03 MED ORDER — FLUTICASONE PROPIONATE 50 MCG/ACT NA SUSP: 2.0000 | Freq: Every day | NASAL | 11 refills | Status: DC

## 2017-04-03 MED ORDER — AMLODIPINE BESYLATE 5 MG PO TABS: 5.0000 mg | ORAL_TABLET | Freq: Every day | ORAL | 1 refills | Status: DC

## 2017-04-03 MED ORDER — CETIRIZINE HCL 10 MG PO TABS: 10.0000 mg | ORAL_TABLET | Freq: Every day | ORAL | 11 refills | Status: DC

## 2017-04-03 NOTE — Progress Notes (Signed)
    MRN: [id number] DOB: 09/14/1955  Subjective:   Alex Curtis is a 61 y.o. male presenting for follow up on Hypertension.   Currently managed with amlodipine. Patient is checking blood pressure at home, generally 876'O systolic. Avoids salt in diet, is exercising. Reports that he does not feel controlled with amlodipine. He feels his heart racing with exercise, feels fatigued. He had a severe headache 2 days ago that has since resolved. Denies dizziness, chronic headache, blurred vision, chest pain, shortness of breath, heart racing, palpitations, nausea, vomiting, abdominal pain, hematuria, lower leg swelling. Denies smoking cigarettes or drinking alcohol.   Also reports persistent nasal congestion, stuffy nose, now having right ear fullness and popping which is really bothersome to the patient. He admits that he has a difficult time with allergies in [place]. Has not taken allergy medication. Denies fever, sinus pain, ear pain, ear drainage and ROS as above.  Alex Curtis has a current medication list which includes the following prescription(s): paroxetine and amlodipine. Also has No Known Allergies.  Alex Curtis  has a past medical history of Anxiety; Hypertension; and Thyroid disease. Also  has a past surgical history that includes Vasectomy.  Objective:   Vitals: BP (!) 139/93   Pulse (!) 101   Temp (!) 97.3 F (36.3 C) (Oral)   Resp 18   Ht 5\' 8"  (1.727 m)   Wt 172 lb (78 kg)   SpO2 96%   BMI 26.15 kg/m   BP Readings from Last 3 Encounters:  04/03/17 (!) 139/93  02/07/17 (!) 138/95  02/01/17 (!) 141/91    Physical Exam  Constitutional: He is oriented to person, place, and time. He appears well-developed and well-nourished.  HENT:  Mouth/Throat: Oropharynx is clear and moist.  Right TM flat but no effusions or erythema, left TM cerumen occluded. Nasal turbinates boggy, nasal passages patent, without sinus tenderness.  Postnasal drip present but without oropharyngeal exudates, erythema or  abscesses.  Eyes: Pupils are equal, round, and reactive to light. EOM are normal. No scleral icterus.  Neck: Normal range of motion. Neck supple. No thyromegaly present.  Cardiovascular: Normal rate, regular rhythm and intact distal pulses.  Exam reveals no gallop and no friction rub.   No murmur heard. Pulmonary/Chest: No respiratory distress. He has no wheezes. He has no rales.  Abdominal: Soft. Bowel sounds are normal. He exhibits no distension and no mass. There is no tenderness. There is no guarding.  Musculoskeletal: Normal range of motion. He exhibits no edema or tenderness.  Neurological: He is alert and oriented to person, place, and time. He displays normal reflexes. Coordination normal.  Skin: Skin is warm and dry.  Psychiatric: He has a normal mood and affect.   Assessment and Plan :   1. Essential hypertension 2. Racing heart beat - Will restart metoprolol but maintain amlodipine. Patient will follow up in 3 months or sooner as directed in clinic.  3. Acute nonintractable headache, unspecified headache type - Neuro exam reassuring, monitor. Return-to-clinic precautions discussed, patient verbalized understanding.   4. Eustachian tube dysfunction, right 5. Environmental allergies - Counseled on diagnosis of ETD, patient will start Zyrtec and Flonase for better control.  Alex Eagles, PA-C [facility] [phone] 04/03/2017  11:29 AM

## 2017-04-03 NOTE — Patient Instructions (Addendum)
Hypertension Hypertension, commonly called high blood pressure, is when the force of blood pumping through the arteries is too strong. The arteries are the blood vessels that carry blood from the heart throughout the body. Hypertension forces the heart to work harder to pump blood and may cause arteries to become narrow or stiff. Having untreated or uncontrolled hypertension can cause heart attacks, strokes, kidney disease, and other problems. A blood pressure reading consists of a higher number over a lower number. Ideally, your blood pressure should be below 120/80. The first ("top") number is called the systolic pressure. It is a measure of the pressure in your arteries as your heart beats. The second ("bottom") number is called the diastolic pressure. It is a measure of the pressure in your arteries as the heart relaxes. What are the causes? The cause of this condition is not known. What increases the risk? Some risk factors for high blood pressure are under your control. Others are not. Factors you can change  Smoking.  Having type 2 diabetes mellitus, high cholesterol, or both.  Not getting enough exercise or physical activity.  Being overweight.  Having too much fat, sugar, calories, or salt (sodium) in your diet.  Drinking too much alcohol. Factors that are difficult or impossible to change  Having chronic kidney disease.  Having a family history of high blood pressure.  Age. Risk increases with age.  Race. You may be at higher risk if you are African-American.  Gender. Men are at higher risk than women before age 45. After age 65, women are at higher risk than men.  Having obstructive sleep apnea.  Stress. What are the signs or symptoms? Extremely high blood pressure (hypertensive crisis) may cause:  Headache.  Anxiety.  Shortness of breath.  Nosebleed.  Nausea and vomiting.  Severe chest pain.  Jerky movements you cannot control (seizures).  How is this  diagnosed? This condition is diagnosed by measuring your blood pressure while you are seated, with your arm resting on a surface. The cuff of the blood pressure monitor will be placed directly against the skin of your upper arm at the level of your heart. It should be measured at least twice using the same arm. Certain conditions can cause a difference in blood pressure between your right and left arms. Certain factors can cause blood pressure readings to be lower or higher than normal (elevated) for a short period of time:  When your blood pressure is higher when you are in a health care provider's office than when you are at home, this is called white coat hypertension. Most people with this condition do not need medicines.  When your blood pressure is higher at home than when you are in a health care provider's office, this is called masked hypertension. Most people with this condition may need medicines to control blood pressure.  If you have a high blood pressure reading during one visit or you have normal blood pressure with other risk factors:  You may be asked to return on a different day to have your blood pressure checked again.  You may be asked to monitor your blood pressure at home for 1 week or longer.  If you are diagnosed with hypertension, you may have other blood or imaging tests to help your health care provider understand your overall risk for other conditions. How is this treated? This condition is treated by making healthy lifestyle changes, such as eating healthy foods, exercising more, and reducing your alcohol intake. Your   health care provider may prescribe medicine if lifestyle changes are not enough to get your blood pressure under control, and if:  Your systolic blood pressure is above 130.  Your diastolic blood pressure is above 80.  Your personal target blood pressure may vary depending on your medical conditions, your age, and other factors. Follow these  instructions at home: Eating and drinking  Eat a diet that is high in fiber and potassium, and low in sodium, added sugar, and fat. An example eating plan is called the DASH (Dietary Approaches to Stop Hypertension) diet. To eat this way: ? Eat plenty of fresh fruits and vegetables. Try to fill half of your plate at each meal with fruits and vegetables. ? Eat whole grains, such as whole wheat pasta, brown rice, or whole grain bread. Fill about one quarter of your plate with whole grains. ? Eat or drink low-fat dairy products, such as skim milk or low-fat yogurt. ? Avoid fatty cuts of meat, processed or cured meats, and poultry with skin. Fill about one quarter of your plate with lean proteins, such as fish, chicken without skin, beans, eggs, and tofu. ? Avoid premade and processed foods. These tend to be higher in sodium, added sugar, and fat.  Reduce your daily sodium intake. Most people with hypertension should eat less than 1,500 mg of sodium a day.  Limit alcohol intake to no more than 1 drink a day for nonpregnant women and 2 drinks a day for men. One drink equals 12 oz of beer, 5 oz of wine, or 1 oz of hard liquor. Lifestyle  Work with your health care provider to maintain a healthy body weight or to lose weight. Ask what an ideal weight is for you.  Get at least 30 minutes of exercise that causes your heart to beat faster (aerobic exercise) most days of the week. Activities may include walking, swimming, or biking.  Include exercise to strengthen your muscles (resistance exercise), such as pilates or lifting weights, as part of your weekly exercise routine. Try to do these types of exercises for 30 minutes at least 3 days a week.  Do not use any products that contain nicotine or tobacco, such as cigarettes and e-cigarettes. If you need help quitting, ask your health care provider.  Monitor your blood pressure at home as told by your health care provider.  Keep all follow-up visits as  told by your health care provider. This is important. Medicines  Take over-the-counter and prescription medicines only as told by your health care provider. Follow directions carefully. Blood pressure medicines must be taken as prescribed.  Do not skip doses of blood pressure medicine. Doing this puts you at risk for problems and can make the medicine less effective.  Ask your health care provider about side effects or reactions to medicines that you should watch for. Contact a health care provider if:  You think you are having a reaction to a medicine you are taking.  You have headaches that keep coming back (recurring).  You feel dizzy.  You have swelling in your ankles.  You have trouble with your vision. Get help right away if:  You develop a severe headache or confusion.  You have unusual weakness or numbness.  You feel faint.  You have severe pain in your chest or abdomen.  You vomit repeatedly.  You have trouble breathing. Summary  Hypertension is when the force of blood pumping through your arteries is too strong. If this condition is not   controlled, it may put you at risk for serious complications.  Your personal target blood pressure may vary depending on your medical conditions, your age, and other factors. For most people, a normal blood pressure is less than 120/80.  Hypertension is treated with lifestyle changes, medicines, or a combination of both. Lifestyle changes include weight loss, eating a healthy, low-sodium diet, exercising more, and limiting alcohol. This information is not intended to replace advice given to you by your health care provider. Make sure you discuss any questions you have with your health care provider. Document Released: 07/16/2005 Document Revised: 06/13/2016 Document Reviewed: 06/13/2016 Elsevier Interactive Patient Education  2018 [place].    Eustachian Tube Dysfunction The eustachian tube connects the middle ear to the  back of the nose. It regulates air pressure in the middle ear by allowing air to move between the ear and nose. It also helps to drain fluid from the middle ear space. When the eustachian tube does not function properly, air pressure, fluid, or both can build up in the middle ear. Eustachian tube dysfunction can affect one or both ears. What are the causes? This condition happens when the eustachian tube becomes blocked or cannot open normally. This may result from:  Ear infections.  Colds and other upper respiratory infections.  Allergies.  Irritation, such as from cigarette smoke or acid from the stomach coming up into the esophagus (gastroesophageal reflux).  Sudden changes in air pressure, such as from descending in an airplane.  Abnormal growths in the nose or throat, such as nasal polyps, tumors, or enlarged tissue at the back of the throat (adenoids).  What increases the risk? This condition may be more likely to develop in people who smoke and people who are overweight. Eustachian tube dysfunction may also be more likely to develop in children, especially children who have:  Certain birth defects of the mouth, such as cleft palate.  Large tonsils and adenoids.  What are the signs or symptoms? Symptoms of this condition may include:  A feeling of fullness in the ear.  Ear pain.  Clicking or popping noises in the ear.  Ringing in the ear.  Hearing loss.  Loss of balance.  Symptoms may get worse when the air pressure around you changes, such as when you travel to an area of high elevation or fly on an airplane. How is this diagnosed? This condition may be diagnosed based on:  Your symptoms.  A physical exam of your ear, nose, and throat.  Tests, such as those that measure: ? The movement of your eardrum (tympanogram). ? Your hearing (audiometry).  How is this treated? Treatment depends on the cause and severity of your condition. If your symptoms are mild, you  may be able to relieve your symptoms by moving air into ("popping") your ears. If you have symptoms of fluid in your ears, treatment may include:  Decongestants.  Antihistamines.  Nasal sprays or ear drops that contain medicines that reduce swelling (steroids).  In some cases, you may need to have a procedure to drain the fluid in your eardrum (myringotomy). In this procedure, a small tube is placed in the eardrum to:  Drain the fluid.  Restore the air in the middle ear space.  Follow these instructions at home:  Take over-the-counter and prescription medicines only as told by your health care provider.  Use techniques to help pop your ears as recommended by your health care provider. These may include: ? Chewing gum. ? Yawning. ?  Frequent, forceful swallowing. ? Closing your mouth, holding your nose closed, and gently blowing as if you are trying to blow air out of your nose.  Do not do any of the following until your health care provider approves: ? Travel to high altitudes. ? Fly in airplanes. ? Work in a Pension scheme manager or room. ? Scuba dive.  Keep your ears dry. Dry your ears completely after showering or bathing.  Do not smoke.  Keep all follow-up visits as told by your health care provider. This is important. Contact a health care provider if:  Your symptoms do not go away after treatment.  Your symptoms come back after treatment.  You are unable to pop your ears.  You have: ? A fever. ? Pain in your ear. ? Pain in your head or neck. ? Fluid draining from your ear.  Your hearing suddenly changes.  You become very dizzy.  You lose your balance. This information is not intended to replace advice given to you by your health care provider. Make sure you discuss any questions you have with your health care provider. Document Released: 08/12/2015 Document Revised: 12/22/2015 Document Reviewed: 08/04/2014 Elsevier Interactive Patient Education  2018 [employer].     IF you received an x-ray today, you will receive an invoice from [facility]. Please contact [facility] at [phone] with questions or concerns regarding your invoice.   IF you received labwork today, you will receive an invoice from [facility]. Please contact [facility] at [phone] with questions or concerns regarding your invoice.   Our billing staff will not be able to assist you with questions regarding bills from these companies.  You will be contacted with the lab results as soon as they are available. The fastest way to get your results is to activate your My Chart account. Instructions are located on the last page of this paperwork. If you have not heard from [place] regarding the results in 2 weeks, please contact this office.

## 2017-05-06 ENCOUNTER — Ambulatory Visit: Payer: Self-pay | Admitting: Physician Assistant

## 2017-07-04 ENCOUNTER — Ambulatory Visit: Payer: Self-pay | Admitting: Urgent Care

## 2017-10-06 ENCOUNTER — Other Ambulatory Visit: Payer: Self-pay | Admitting: Urgent Care

## 2017-10-06 DIAGNOSIS — I10 Essential (primary) hypertension: Secondary | ICD-10-CM

## 2017-10-28 ENCOUNTER — Encounter: Payer: Self-pay | Admitting: Physician Assistant

## 2017-11-09 ENCOUNTER — Other Ambulatory Visit: Payer: Self-pay | Admitting: Urgent Care

## 2017-11-09 DIAGNOSIS — I10 Essential (primary) hypertension: Secondary | ICD-10-CM

## 2017-11-11 NOTE — Telephone Encounter (Signed)
Metoprolol ER 50 mg tablet refill request.  Had appt for 07/04/17 but was a no show.    [place], [place].

## 2017-12-04 ENCOUNTER — Ambulatory Visit: Payer: Self-pay | Admitting: Physician Assistant

## 2017-12-04 ENCOUNTER — Other Ambulatory Visit: Payer: Self-pay

## 2017-12-04 ENCOUNTER — Encounter: Payer: Self-pay | Admitting: Physician Assistant

## 2017-12-04 VITALS — BP 142/92 | HR 91 | Temp 98.7°F | Resp 18 | Ht 68.11 in | Wt 178.6 lb

## 2017-12-04 DIAGNOSIS — J069 Acute upper respiratory infection, unspecified: Secondary | ICD-10-CM

## 2017-12-04 DIAGNOSIS — R030 Elevated blood-pressure reading, without diagnosis of hypertension: Secondary | ICD-10-CM

## 2017-12-04 MED ORDER — HYDROCODONE-HOMATROPINE 5-1.5 MG/5ML PO SYRP: 5.0000 mL | ORAL_SOLUTION | Freq: Three times a day (TID) | ORAL | 0 refills | Status: DC | PRN

## 2017-12-04 MED ORDER — BENZONATATE 100 MG PO CAPS: 100.0000 mg | ORAL_CAPSULE | Freq: Three times a day (TID) | ORAL | 0 refills | Status: DC | PRN

## 2017-12-04 MED ORDER — GUAIFENESIN ER 1200 MG PO TB12: 1.0000 | ORAL_TABLET | Freq: Two times a day (BID) | ORAL | 0 refills | Status: DC | PRN

## 2017-12-04 MED ORDER — AZELASTINE HCL 0.1 % NA SOLN: 2.0000 | Freq: Two times a day (BID) | NASAL | 0 refills | Status: DC

## 2017-12-04 NOTE — Patient Instructions (Addendum)
- We will treat this as a respiratory viral infection.  - I recommend you rest, drink plenty of fluids, eat light meals including soups.  - You may use cough syrup at night for your cough and sore throat, mucinex to help bring stuff up and tessalon perlse to stop the cough. Be aware that cough syrup can definitely make you drowsy and sleepy so do not drive or operate any heavy machinery if it is affecting you during the day.  - Astelin nasal spray for congestion.  -Tea recipe for cough: boil water, add 2 inches shaved ginger root, steep 15 minutes, add juice from 2 full lemons, and 2 tbsp honey. - Please let me know if you are not seeing any improvement or get worse in 5-7 days.  In terms of elevated blood pressure, I would like you to check your blood pressure at least a couple times over the next week outside of the office and document these values. It is best if you check the blood pressure at different times in the day. Your goal is <140/90. If your values are consistently above this goal, please return to office for further evaluation. If you start to have chest pain, blurred vision, shortness of breath, severe headache, lower leg swelling, or nausea/vomiting please seek care immediately here or at the ED.       Upper Respiratory Infection, Adult Most upper respiratory infections (URIs) are caused by a virus. A URI affects the nose, throat, and upper air passages. The most common type of URI is often called "the common cold." Follow these instructions at home:  Take medicines only as told by your doctor.  Gargle warm saltwater or take cough drops to comfort your throat as told by your doctor.  Use a warm mist humidifier or inhale steam from a shower to increase air moisture. This may make it easier to breathe.  Drink enough fluid to keep your pee (urine) clear or pale yellow.  Eat soups and other clear broths.  Have a healthy diet.  Rest as needed.  Go back to work when your fever  is gone or your doctor says it is okay. ? You may need to stay home longer to avoid giving your URI to others. ? You can also wear a face mask and wash your hands often to prevent spread of the virus.  Use your inhaler more if you have asthma.  Do not use any tobacco products, including cigarettes, chewing tobacco, or electronic cigarettes. If you need help quitting, ask your doctor. Contact a doctor if:  You are getting worse, not better.  Your symptoms are not helped by medicine.  You have chills.  You are getting more short of breath.  You have brown or red mucus.  You have yellow or brown discharge from your nose.  You have pain in your face, especially when you bend forward.  You have a fever.  You have puffy (swollen) neck glands.  You have pain while swallowing.  You have white areas in the back of your throat. Get help right away if:  You have very bad or constant: ? Headache. ? Ear pain. ? Pain in your forehead, behind your eyes, and over your cheekbones (sinus pain). ? Chest pain.  You have long-lasting (chronic) lung disease and any of the following: ? Wheezing. ? Long-lasting cough. ? Coughing up blood. ? A change in your usual mucus.  You have a stiff neck.  You have changes in your: ?  Vision. ? Hearing. ? Thinking. ? Mood. This information is not intended to replace advice given to you by your health care provider. Make sure you discuss any questions you have with your health care provider. Document Released: 01/02/2008 Document Revised: 03/18/2016 Document Reviewed: 10/21/2013 Elsevier Interactive Patient Education  2018 [employer].  IF you received an x-ray today, you will receive an invoice from [facility]. Please contact [facility] at [phone] with questions or concerns regarding your invoice.   IF you received labwork today, you will receive an invoice from [facility]. Please contact [facility] at [phone] with  questions or concerns regarding your invoice.   Our billing staff will not be able to assist you with questions regarding bills from these companies.  You will be contacted with the lab results as soon as they are available. The fastest way to get your results is to activate your My Chart account. Instructions are located on the last page of this paperwork. If you have not heard from [place] regarding the results in 2 weeks, please contact this office.

## 2017-12-04 NOTE — Progress Notes (Signed)
MRN: [id number] DOB: 11/29/1955  Subjective:   Alex Curtis is a 62 y.o. male presenting for chief complaint of Cough (X 3 days) .  Reports 3 day history of productive cough, worse at night, cannot get any sleep.  Has some nasal congestion. Has tried Nyquil with no relief. Denies fever, sinus pain,  ear pain, sore throat, wheezing, shortness of breath, chest pain, night sweats, chills, weight loss, nausea, vomiting, abdominal pain and diarrhea. Was recently on a plane form [place] so not sure if he was exposed to anyone who was sick. No history of seasonal allergies, no history of asthma.  Denies smoking . Denies any other aggravating or relieving factors, no other questions or concerns.  Checks bp outside of office. It runs around 361W systolically so he does not take medication anymore.   Alex Curtis has a current medication list which includes the following prescription(s): metoprolol succinate, paroxetine, amlodipine, azelastine, benzonatate, cetirizine, fluticasone, guaifenesin, and hydrocodone-homatropine. Also has No Known Allergies.  Alex Curtis  has a past medical history of Anxiety, Hypertension, and Thyroid disease. Also  has a past surgical history that includes Vasectomy.   Objective:   Vitals: BP (!) 142/92 (BP Location: Right Arm, Patient Position: Sitting, Cuff Size: Large)   Pulse 91   Temp 98.7 F (37.1 C) (Oral)   Resp 18   Ht 5' 8.11" (1.73 m)   Wt 178 lb 9.6 oz (81 kg)   SpO2 96%   BMI 27.07 kg/m   Physical Exam  Constitutional: He is oriented to person, place, and time. He appears well-developed and well-nourished. No distress.  HENT:  Head: Normocephalic and atraumatic.  Right Ear: Tympanic membrane, external ear and ear canal normal. Tympanic membrane is not erythematous and not bulging.  Left Ear: Tympanic membrane, external ear and ear canal normal. Tympanic membrane is not erythematous and not bulging.  Nose: Mucosal edema (moderate bilaterally) present. Right  sinus exhibits no maxillary sinus tenderness and no frontal sinus tenderness. Left sinus exhibits no maxillary sinus tenderness and no frontal sinus tenderness.  Mouth/Throat: Uvula is midline, oropharynx is clear and moist and mucous membranes are normal. Tonsils are 1+ on the right. Tonsils are 1+ on the left. No tonsillar exudate.  Eyes: Conjunctivae are normal. Right eye discharge:    Neck: Normal range of motion.  Cardiovascular: Normal rate, regular rhythm, normal heart sounds and intact distal pulses.  Pulmonary/Chest: Effort normal and breath sounds normal. He has no decreased breath sounds. He has no wheezes. He has no rhonchi. He has no rales.  Lymphadenopathy:       Head (right side): No submental, no submandibular, no tonsillar, no preauricular, no posterior auricular and no occipital adenopathy present.       Head (left side): No submental, no submandibular, no tonsillar, no preauricular, no posterior auricular and no occipital adenopathy present.    He has no cervical adenopathy.       Right: No supraclavicular adenopathy present.       Left: No supraclavicular adenopathy present.  Neurological: He is alert and oriented to person, place, and time.  Skin: Skin is warm and dry.  Psychiatric: He has a normal mood and affect.  Vitals reviewed.   No results found for this or any previous visit (from the past 24 hour(s)). BP Readings from Last 3 Encounters:  12/04/17 (!) 142/92  04/03/17 (!) 139/93  02/07/17 (!) 138/95    Assessment and Plan :  1. Acute upper respiratory infection -  Likely viral in etiology d/t reassuring physical exam findings  - Advised supportive care, offered symptomatic relief. - Return to clinic if symptoms worsen or fail to improve in 5-7 days, otherwise return to clinic as needed. - benzonatate (TESSALON) 100 MG capsule; Take 1-2 capsules (100-200 mg total) by mouth 3 (three) times daily as needed for cough.  Dispense: 40 capsule; Refill: 0 -  HYDROcodone-homatropine (HYCODAN) 5-1.5 MG/5ML syrup; Take 5 mLs by mouth every 8 (eight) hours as needed for cough.  Dispense: 75 mL; Refill: 0 - azelastine (ASTELIN) 0.1 % nasal spray; Place 2 sprays into both nostrils 2 (two) times daily. Use in each nostril as directed  Dispense: 30 mL; Refill: 0 - Guaifenesin (MUCINEX MAXIMUM STRENGTH) 1200 MG TB12; Take 1 tablet (1,200 mg total) by mouth every 12 (twelve) hours as needed.  Dispense: 14 tablet; Refill: 0  2. Elevated blood pressure reading Asymptomatic. Instructed to check bp outside of office over the next couple of weeks. Return if consistently >140/90. Given strict ED precautions.     Tenna Delaine, PA-C  [facility] 12/04/2017 6:19 PM

## 2017-12-23 ENCOUNTER — Telehealth: Payer: Self-pay | Admitting: Urgent Care

## 2017-12-23 DIAGNOSIS — I10 Essential (primary) hypertension: Secondary | ICD-10-CM

## 2017-12-25 NOTE — Telephone Encounter (Signed)
Pt needs a refill on metoprolol.  He is completely out. [phone]

## 2017-12-26 NOTE — Telephone Encounter (Signed)
Please schedule. Per [place] LOV note: states to return for BP meds if readings elevated.

## 2017-12-31 ENCOUNTER — Telehealth: Payer: Self-pay | Admitting: Physician Assistant

## 2017-12-31 NOTE — Telephone Encounter (Signed)
Called and tried to make an OV with Alex Curtis for a OV per CRM. If pt calls back, please make him an OV with Alex Curtis at his convenience for High Blood Pressure.

## 2018-01-02 ENCOUNTER — Encounter: Payer: Self-pay | Admitting: Physician Assistant

## 2018-01-02 ENCOUNTER — Other Ambulatory Visit: Payer: Self-pay

## 2018-01-02 ENCOUNTER — Ambulatory Visit: Payer: Self-pay | Admitting: Physician Assistant

## 2018-01-02 VITALS — BP 130/96 | HR 100 | Temp 99.1°F | Resp 16 | Ht 68.5 in | Wt 173.8 lb

## 2018-01-02 DIAGNOSIS — Z125 Encounter for screening for malignant neoplasm of prostate: Secondary | ICD-10-CM

## 2018-01-02 DIAGNOSIS — F329 Major depressive disorder, single episode, unspecified: Secondary | ICD-10-CM

## 2018-01-02 DIAGNOSIS — I10 Essential (primary) hypertension: Secondary | ICD-10-CM

## 2018-01-02 DIAGNOSIS — F32A Depression, unspecified: Secondary | ICD-10-CM

## 2018-01-02 LAB — POCT URINALYSIS DIP (MANUAL ENTRY)
BILIRUBIN UA: NEGATIVE
Blood, UA: NEGATIVE
Glucose, UA: NEGATIVE mg/dL
LEUKOCYTES UA: NEGATIVE
Nitrite, UA: NEGATIVE
PROTEIN UA: NEGATIVE mg/dL
Spec Grav, UA: 1.02 (ref 1.010–1.025)
Urobilinogen, UA: 0.2 E.U./dL
pH, UA: 6 (ref 5.0–8.0)

## 2018-01-02 MED ORDER — PAROXETINE HCL 20 MG PO TABS: 10.0000 mg | ORAL_TABLET | ORAL | 0 refills | Status: DC

## 2018-01-02 MED ORDER — METOPROLOL SUCCINATE ER 50 MG PO TB24: 50.0000 mg | ORAL_TABLET | Freq: Every day | ORAL | 1 refills | Status: AC

## 2018-01-02 NOTE — Progress Notes (Signed)
MRN: [id number] DOB: 18-Aug-1955  Subjective:   Alex Curtis is a 62 y.o. male presenting for follow up on Hypertension and depression.  HTN: Currently managed with metoprolol 73m daily. Patient is checking blood pressure at home, range is 1740Csystolic. Reports tolerating medication well, has been out for 3 days. Denies lightheadedness, dizziness, chronic headache, double vision, chest pain, shortness of breath, heart racing, palpitations, nausea, vomiting, abdominal pain, hematuria, lower leg swelling. Lifestyle: Avoiding excessive salt intake. Trying to exercise on a regular basis. Denies smoking. FH of HTN in mother.   Depression: Has been on paxil 251mfor 2 years. Started it after going through a divorce 2 years ago. After 6 months, he felt he was completely back to normal. He has continued to take medication just because. He reports that his life is great right now. He is very happy. Denies dysphoric mood, decreased pleasure in doing things, sleep disturbance, SI, and HI. 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   He wants his PSA checked. No symptoms. No FH of prostate cancer.  Review of Systems  Genitourinary: Negative for dysuria, flank pain, frequency, hematuria and urgency.       Negative for ED.     JoJameiras a current medication list which includes the following prescription(s): metoprolol succinate, paroxetine, trazodone, azelastine, fluticasone, and metoprolol tartrate. Also has No Known Allergies.  JoKendelhas a past medical history of Anxiety, Hypertension, and Thyroid disease. Also  has a past surgical history that includes Vasectomy.    Social History   Socioeconomic History  . Marital status: Divorced    Spouse name: Not on file  . Number of children: 2  . Years of education: Not on file  . Highest education level: Not on file  Occupational History  . Not on file  Social Needs  . Financial resource strain: Not on file  . Food  insecurity:    Worry: Not on file    Inability: Not on file  . Transportation needs:    Medical: Not on file    Non-medical: Not on file  Tobacco Use  . Smoking status: Former SmResearch scientist (life sciences). Smokeless tobacco: Never Used  Substance and Sexual Activity  . Alcohol use: Yes    Alcohol/week: 0.0 oz    Comment: 1 glass of wine once a week  . Drug use: No  . Sexual activity: Yes  Lifestyle  . Physical activity:    Days per week: Not on file    Minutes per session: Not on file  . Stress: Not on file  Relationships  . Social connections:    Talks on phone: Not on file    Gets together: Not on file    Attends religious service: Not on file    Active member of club or organization: Not on file    Attends meetings of clubs or organizations: Not on file    Relationship status: Not on file  . Intimate partner violence:    Fear of current or ex partner: Not on file    Emotionally abused: Not on file    Physically abused: Not on file    Forced sexual activity: Not on file  Other Topics Concern  . Not on file  Social History Narrative  . Not on file    Objective:   Vitals: BP (!) 130/96 (BP Location: Right Arm, Cuff Size: Normal)   Pulse 100   Temp 99.1  F (37.3 C) (Oral)   Resp 16   Ht 5' 8.5" (1.74 m)   Wt 173 lb 12.8 oz (78.8 kg)   SpO2 96%   BMI 26.04 kg/m   Physical Exam  Constitutional: He is oriented to person, place, and time. He appears well-developed and well-nourished. No distress.  HENT:  Head: Normocephalic and atraumatic.  Mouth/Throat: Uvula is midline, oropharynx is clear and moist and mucous membranes are normal. No tonsillar exudate.  Eyes: Pupils are equal, round, and reactive to light. Conjunctivae and EOM are normal.  Neck: Normal range of motion.  Cardiovascular: Normal rate, regular rhythm, normal heart sounds and intact distal pulses.  Pulmonary/Chest: Effort normal and breath sounds normal. He has no decreased breath sounds. He has no wheezes. He has  no rhonchi. He has no rales.  Musculoskeletal:       Right lower leg: He exhibits no swelling.       Left lower leg: He exhibits no swelling.  Neurological: He is alert and oriented to person, place, and time.  Skin: Skin is warm and dry.  Psychiatric: He has a normal mood and affect.  Vitals reviewed.   Results for orders placed or performed in visit on 01/02/18 (from the past 24 hour(s))  POCT urinalysis dipstick     Status: Abnormal   Collection Time: 01/02/18 12:06 PM  Result Value Ref Range   Color, UA yellow yellow   Clarity, UA clear clear   Glucose, UA negative negative mg/dL   Bilirubin, UA negative negative   Ketones, POC UA trace (5) (A) negative mg/dL   Spec Grav, UA 1.020 1.010 - 1.025   Blood, UA negative negative   pH, UA 6.0 5.0 - 8.0   Protein Ur, POC negative negative mg/dL   Urobilinogen, UA 0.2 0.2 or 1.0 E.U./dL   Nitrite, UA Negative Negative   Leukocytes, UA Negative Negative    Depression screen [facility] 2/9 01/02/2018 01/02/2018 12/04/2017 04/03/2017 02/07/2017  Decreased Interest 0 0 0 0 0  Down, Depressed, Hopeless 0 0 0 0 0  PHQ - 2 Score 0 0 0 0 0  Altered sleeping 0 - - - -  Tired, decreased energy 0 - - - -  Change in appetite 0 - - - -  Feeling bad or failure about yourself  0 - - - -  Trouble concentrating 0 - - - -  Moving slowly or fidgety/restless 0 - - - -  Suicidal thoughts 0 - - - -  PHQ-9 Score 0 - - - -  Difficult doing work/chores Not difficult at all - - - -   BP Readings from Last 3 Encounters:  01/02/18 (!) 130/96  12/04/17 (!) 142/92  04/03/17 (!) 139/93    Wt Readings from Last 3 Encounters:  01/02/18 173 lb 12.8 oz (78.8 kg)  12/04/17 178 lb 9.6 oz (81 kg)  04/03/17 172 lb (78 kg)    Assessment and Plan :  1. Essential hypertension Home bp recordings controlled. He has been out of medication for 3 days, which is likely why bp is elevated in office today. He is asx. Labs pending. F/u in 4 weeks. Given strict ED precautions.  -  CBC with Differential/Platelet - CMP14+EGFR - Lipid panel - TSH - POCT urinalysis dipstick - metoprolol succinate (TOPROL-XL) 50 MG 24 hr tablet; Take 1 tablet (50 mg total) by mouth daily. Take with or immediately following a meal.  Dispense: 90 tablet; Refill: 1  2. Depression, unspecified depression  type Believe this was all situational around divorce. He is doing very well at this time. PHQ score 0. Rec decreasing paxil dose to 39m to see how he tolerates it. F/u in 4 weeks, if tolerating decrease well, can consider tapering at that time.  - PARoxetine (PAXIL) 20 MG tablet; Take 0.5 tablets (10 mg total) by mouth every morning.  Dispense: 90 tablet; Refill: 0  3. Screening PSA (prostate specific antigen) - PSA  BTenna Delaine PA-C  [facility] [place]6/12/2017 12:42 PM

## 2018-01-02 NOTE — Patient Instructions (Addendum)
In terms of elevated blood pressure, I would like you to start your medication and check your blood pressure at least a couple times over the next few weeks outside of the office and document these values. It is best if you check the blood pressure at different times in the day. Your goal is <140/90. Follow up in 4 weeks. If you start to have chest pain, blurred vision, shortness of breath, severe headache, lower leg swelling, or nausea/vomiting please seek care immediately here or at the ED.   For paxil use, please decrease to half a tablet daily and see how you tolerate that. If you are still doing well in 4 weeks, we can consider tapering you off of it. Thank you for letting me participate in your health and well being.    IF you received an x-ray today, you will receive an invoice from [facility]. Please contact [facility] at [phone] with questions or concerns regarding your invoice.   IF you received labwork today, you will receive an invoice from [facility]. Please contact [facility] at [phone] with questions or concerns regarding your invoice.   Our billing staff will not be able to assist you with questions regarding bills from these companies.  You will be contacted with the lab results as soon as they are available. The fastest way to get your results is to activate your My Chart account. Instructions are located on the last page of this paperwork. If you have not heard from [place] regarding the results in 2 weeks, please contact this office.

## 2018-01-03 LAB — CBC WITH DIFFERENTIAL/PLATELET
BASOS: 1 %
Basophils Absolute: 0.1 10*3/uL (ref 0.0–0.2)
EOS (ABSOLUTE): 0.2 10*3/uL (ref 0.0–0.4)
EOS: 4 %
HEMATOCRIT: 44.3 % (ref 37.5–51.0)
Hemoglobin: 15.5 g/dL (ref 13.0–17.7)
IMMATURE GRANS (ABS): 0 10*3/uL (ref 0.0–0.1)
IMMATURE GRANULOCYTES: 0 %
Lymphocytes Absolute: 1.6 10*3/uL (ref 0.7–3.1)
Lymphs: 27 %
MCH: 29.3 pg (ref 26.6–33.0)
MCHC: 35 g/dL (ref 31.5–35.7)
MCV: 84 fL (ref 79–97)
MONOS ABS: 0.3 10*3/uL (ref 0.1–0.9)
Monocytes: 5 %
NEUTROS PCT: 63 %
Neutrophils Absolute: 3.7 10*3/uL (ref 1.4–7.0)
Platelets: 177 10*3/uL (ref 150–450)
RBC: 5.29 x10E6/uL (ref 4.14–5.80)
RDW: 14.5 % (ref 12.3–15.4)
WBC: 5.8 10*3/uL (ref 3.4–10.8)

## 2018-01-03 LAB — LIPID PANEL
CHOL/HDL RATIO: 3.2 ratio (ref 0.0–5.0)
Cholesterol, Total: 218 mg/dL — ABNORMAL HIGH (ref 100–199)
HDL: 69 mg/dL (ref >39)
LDL Calculated: 117 mg/dL — ABNORMAL HIGH (ref 0–99)
Triglycerides: 160 mg/dL — ABNORMAL HIGH (ref 0–149)
VLDL Cholesterol Cal: 32 mg/dL (ref 5–40)

## 2018-01-03 LAB — CMP14+EGFR
A/G RATIO: 1.7 (ref 1.2–2.2)
ALT: 19 IU/L (ref 0–44)
AST: 22 IU/L (ref 0–40)
Albumin: 4.3 g/dL (ref 3.6–4.8)
Alkaline Phosphatase: 64 IU/L (ref 39–117)
BILIRUBIN TOTAL: 0.8 mg/dL (ref 0.0–1.2)
BUN / CREAT RATIO: 17 (ref 10–24)
BUN: 18 mg/dL (ref 8–27)
CALCIUM: 9.3 mg/dL (ref 8.6–10.2)
CO2: 22 mmol/L (ref 20–29)
Chloride: 105 mmol/L (ref 96–106)
Creatinine, Ser: 1.05 mg/dL (ref 0.76–1.27)
GFR, EST AFRICAN AMERICAN: 89 mL/min/{1.73_m2} (ref >59)
GFR, EST NON AFRICAN AMERICAN: 77 mL/min/{1.73_m2} (ref >59)
GLOBULIN, TOTAL: 2.6 g/dL (ref 1.5–4.5)
Glucose: 110 mg/dL — ABNORMAL HIGH (ref 65–99)
POTASSIUM: 4.3 mmol/L (ref 3.5–5.2)
SODIUM: 145 mmol/L — AB (ref 134–144)
TOTAL PROTEIN: 6.9 g/dL (ref 6.0–8.5)

## 2018-01-03 LAB — PSA: PROSTATE SPECIFIC AG, SERUM: 7.9 ng/mL — AB (ref 0.0–4.0)

## 2018-01-03 LAB — TSH: TSH: 2.09 u[IU]/mL (ref 0.450–4.500)

## 2018-01-15 ENCOUNTER — Other Ambulatory Visit: Payer: Self-pay | Admitting: Physician Assistant

## 2018-01-15 DIAGNOSIS — R972 Elevated prostate specific antigen [PSA]: Secondary | ICD-10-CM

## 2018-01-15 MED ORDER — ATORVASTATIN CALCIUM 20 MG PO TABS: 20.0000 mg | ORAL_TABLET | Freq: Every day | ORAL | 3 refills | Status: AC

## 2018-01-15 NOTE — Progress Notes (Signed)
Meds ordered this encounter  Medications  . atorvastatin (LIPITOR) 20 MG tablet    Sig: Take 1 tablet (20 mg total) by mouth daily.    Dispense:  90 tablet    Refill:  3    Order Specific Question:   Supervising Provider    Answer:   Wardell Honour [[id number]]   Orders Placed This Encounter  Procedures  . Ambulatory referral to Urology    Referral Priority:   Routine    Referral Type:   Consultation    Referral Reason:   Specialty Services Required    Requested Specialty:   Urology    Number of Visits Requested:   1  .

## 2018-04-08 ENCOUNTER — Telehealth: Payer: Self-pay | Admitting: Physician Assistant

## 2018-04-08 NOTE — Telephone Encounter (Signed)
Copied from [place] [phone]. Topic: Quick Communication - See Telephone Encounter >> Apr 08, 2018  3:19 PM Neva Seat wrote: Julie w/ [place] - [phone] Case # [id number]  Needing to know if the medical records received their fax for past 5 yrs medical records.

## 2018-04-08 NOTE — Telephone Encounter (Unsigned)
Copied from [place] #[id number]. >> Apr 08, 2018  3:14 PM Neva Seat wrote: Julie w/ [place] - [phone] Case # [id number]  Needing to know if the medical records received their fax for past 5 yrs medical records.

## 2018-04-09 NOTE — Telephone Encounter (Signed)
Please see note below. 

## 2018-04-17 ENCOUNTER — Other Ambulatory Visit: Payer: Self-pay | Admitting: Physician Assistant

## 2018-04-17 DIAGNOSIS — F329 Major depressive disorder, single episode, unspecified: Secondary | ICD-10-CM

## 2018-04-17 DIAGNOSIS — F32A Depression, unspecified: Secondary | ICD-10-CM

## 2018-04-17 NOTE — Telephone Encounter (Signed)
Paroxetine 20 mg  refill Last Refill:01/02/18 # 90  (pt taking 0.5 tab daily) Last OV: 01/02/18 PCP: B. Fallon:  Vivian  Too soon for a refill

## 2018-04-18 NOTE — Telephone Encounter (Signed)
Pt of [place], [place]

## 2018-04-18 NOTE — Telephone Encounter (Signed)
Pt of [place], [place]  Thank you.

## 2018-04-18 NOTE — Telephone Encounter (Signed)
paroxetine refill Last Refill:01/02/18 # 90 Last OV: 01/02/18 PCP: Tenna Delaine PA-C [place]. Pt did not f/u in 4 weeks as per note written 01/02/18

## 2022-11-01 ENCOUNTER — Other Ambulatory Visit: Payer: Self-pay

## 2022-11-01 ENCOUNTER — Emergency Department (HOSPITAL_COMMUNITY)
Admission: EM | Admit: 2022-11-01 | Discharge: 2022-11-01 | Disposition: A | Discharge status: Home / Self Care | Payer: Medicare HMO | Attending: Emergency Medicine | Admitting: Emergency Medicine

## 2022-11-01 DIAGNOSIS — Z79899 Other long term (current) drug therapy: Secondary | ICD-10-CM | POA: Diagnosis not present

## 2022-11-01 DIAGNOSIS — I10 Essential (primary) hypertension: Secondary | ICD-10-CM | POA: Insufficient documentation

## 2022-11-01 DIAGNOSIS — R519 Headache, unspecified: Secondary | ICD-10-CM | POA: Diagnosis present

## 2022-11-01 LAB — BASIC METABOLIC PANEL
Anion gap: 8 (ref 5–15)
BUN: 19 mg/dL (ref 8–23)
CO2: 23 mmol/L (ref 22–32)
Calcium: 8.6 mg/dL — ABNORMAL LOW (ref 8.9–10.3)
Chloride: 105 mmol/L (ref 98–111)
Creatinine, Ser: 1.01 mg/dL (ref 0.61–1.24)
GFR, Estimated: >60 mL/min (ref >60)
Glucose, Bld: 92 mg/dL (ref 70–99)
Potassium: 4 mmol/L (ref 3.5–5.1)
Sodium: 136 mmol/L (ref 135–145)

## 2022-11-01 LAB — TROPONIN I (HIGH SENSITIVITY): Troponin I (High Sensitivity): 3 ng/L (ref <18)

## 2022-11-01 MED ORDER — LISINOPRIL 10 MG PO TABS: 10.0000 mg | ORAL_TABLET | Freq: Every day | ORAL | 0 refills | Status: AC

## 2022-11-01 MED ORDER — LISINOPRIL 10 MG PO TABS
10.0000 mg | ORAL_TABLET | Freq: Once | ORAL | Status: AC
  Administered 2022-11-01: 10 mg via ORAL
  Filled 2022-11-01: qty 1

## 2022-11-01 NOTE — ED Provider Notes (Signed)
[facility] Provider Note   CSN: [id number] Arrival date & time: 11/01/22  1927     History  Chief Complaint  Patient presents with   Hypertension    Alex Curtis is a 67 y.o. male with a history of hypertension presented ED with concern for high blood pressure and mild frontal headache.  Patient reports his blood pressures been trending upwards over the past few months but this week is systolic pressure went from 140 average to 170 mmhg.  He noted a mild frontal headache today.  He is concerned about his blood pressure going up.  He takes telmisartan 80 mg that was prescribed by a PCP that he has in [place], whom he visits twice a year.  He does not smoke.  He does not eat salty diets.  He is not on any additional high blood pressure medications.  Does report a family history of hypertension.  He denies any personal history of coronary disease or cardiac issues.  He denies chest pain or pressure  HPI     Home Medications Prior to Admission medications   Medication Sig Start Date End Date Taking? Authorizing Provider  lisinopril (ZESTRIL) 10 MG tablet Take 1 tablet (10 mg total) by mouth daily for 30 doses. 11/01/22 12/01/22 Yes Zeenat Jeanbaptiste, Carola Rhine, MD  atorvastatin (LIPITOR) 20 MG tablet Take 1 tablet (20 mg total) by mouth daily. 01/15/18   Tenna Delaine D, PA-C  azelastine (ASTELIN) 0.1 % nasal spray Place 2 sprays into both nostrils 2 (two) times daily. Use in each nostril as directed Patient not taking: Reported on 01/02/2018 12/04/17   Tenna Delaine D, PA-C  fluticasone [facility]) 50 MCG/ACT nasal spray Place 2 sprays into both nostrils daily. Patient not taking: Reported on 12/04/2017 04/03/17   Jaynee Eagles, PA-C  metoprolol succinate (TOPROL-XL) 50 MG 24 hr tablet Take 1 tablet (50 mg total) by mouth daily. Take with or immediately following a meal. 01/02/18   Timmothy Euler, [place] D, PA-C  PARoxetine (PAXIL) 20 MG tablet TAKE 1/2 (ONE-HALF) TABLET  BY MOUTH ONCE DAILY IN THE MORNING 04/22/18   McVey, Gelene Mink, PA-C      Allergies    Patient has no known allergies.    Review of Systems   Review of Systems  Physical Exam Updated Vital Signs BP (!) 151/101   Pulse 64   Temp 98.7 F (37.1 C) (Oral)   Resp 12   SpO2 97%  Physical Exam Constitutional:      General: He is not in acute distress. HENT:     Head: Normocephalic and atraumatic.  Eyes:     Conjunctiva/sclera: Conjunctivae normal.     Pupils: Pupils are equal, round, and reactive to light.  Cardiovascular:     Rate and Rhythm: Normal rate and regular rhythm.  Pulmonary:     Effort: Pulmonary effort is normal. No respiratory distress.  Abdominal:     General: There is no distension.     Tenderness: There is no abdominal tenderness.  Skin:    General: Skin is warm and dry.  Neurological:     General: No focal deficit present.     Mental Status: He is alert. Mental status is at baseline.  Psychiatric:        Mood and Affect: Mood normal.        Behavior: Behavior normal.     ED Results / Procedures / Treatments   Labs (all labs ordered are listed, but  only abnormal results are displayed) Labs Reviewed  BASIC METABOLIC PANEL - Abnormal; Notable for the following components:      Result Value   Calcium 8.6 (*)    All other components within normal limits  TROPONIN I (HIGH SENSITIVITY)  TROPONIN I (HIGH SENSITIVITY)    EKG EKG Interpretation  Date/Time:  Thursday November 01 2022 19:36:42 EDT Ventricular Rate:  66 PR Interval:  162 QRS Duration: 92 QT Interval:  398 QTC Calculation: 417 R Axis:   87 Text Interpretation: Sinus rhythm Borderline right axis deviation Confirmed by Octaviano Glow [phone]) on 11/01/2022 8:38:56 PM  Radiology No results found.  Procedures Procedures    Medications Ordered in ED Medications  lisinopril (ZESTRIL) tablet 10 mg (10 mg Oral Given 11/01/22 2016)    ED Course/ Medical Decision Making/ A&P                              Medical Decision Making Amount and/or Complexity of Data Reviewed Labs: ordered.  Risk Prescription drug management.   Patient is here with concern for hypertension.  Low suspicion for acute hypertensive emergency.  Low suspicion for stroke.  No indication for emergent neuroimaging at this time.  I personally reviewed his EKG and interpreted this which shows normal sinus rhythm no acute ischemic findings.  I reviewed his labs here, which showed no emergent findings.  I think it is reasonable to start the patient on lisinopril at this time to supplement his ARB, but explained that we do not prescribe long-term blood pressure medications out of the ER.  I will prescribe him 30 days and have him follow-up again with his doctor.  He has a blood pressure machine and an app at home and I discussed with him how to keep track of his blood pressures daily, as the trend is important.  He verbalized understanding.  Okay for discharge        Final Clinical Impression(s) / ED Diagnoses Final diagnoses:  Hypertension, unspecified type    Rx / DC Orders ED Discharge Orders          Ordered    lisinopril (ZESTRIL) 10 MG tablet  Daily        11/01/22 2053              Wyvonnia Dusky, MD 11/01/22 2216

## 2022-11-01 NOTE — Discharge Instructions (Addendum)
Please call your doctors office to talk about your high blood pressure.  I recommend to keep a daily diary of your blood pressure check your pressure in the morning as well as the evening, writing down your blood pressure levels.  It is helpful for your doctor's office to see what your average blood pressures have been.  To make further adjustments to your medications in the future.

## 2022-11-01 NOTE — ED Triage Notes (Signed)
Pt states that he checks his blood pressure everyday, and today it has been 160-170's. He has had some constant dizziness over the past hour and a pressure over his head/eyes.

## 2022-11-28 HISTORY — PX: COLONOSCOPY WITH PROPOFOL: SHX5780

## 2022-12-13 ENCOUNTER — Other Ambulatory Visit (HOSPITAL_COMMUNITY): Payer: Self-pay | Admitting: Urology

## 2022-12-13 ENCOUNTER — Encounter (HOSPITAL_BASED_OUTPATIENT_CLINIC_OR_DEPARTMENT_OTHER): Payer: Self-pay | Admitting: Urology

## 2022-12-13 ENCOUNTER — Other Ambulatory Visit: Payer: Self-pay | Admitting: Urology

## 2022-12-13 DIAGNOSIS — R972 Elevated prostate specific antigen [PSA]: Secondary | ICD-10-CM

## 2022-12-17 ENCOUNTER — Encounter (HOSPITAL_BASED_OUTPATIENT_CLINIC_OR_DEPARTMENT_OTHER): Payer: Self-pay | Admitting: Urology

## 2022-12-17 NOTE — Progress Notes (Signed)
Spoke w/ via phone for pre-op interview--- pt Lab needs dos----  Mirant results------ current EKG in epic/ chart COVID test -----patient states asymptomatic no test needed Arrive at ------- 0900 on 12-25-2022 NPO after MN NO Solid Food.  Clear liquids from MN until--- 0800 Med rec completed Medications to take morning of surgery ----- none Diabetic medication ----- n/a Patient instructed no nail polish to be worn day of surgery Patient instructed to bring photo id and insurance card day of surgery Patient aware to have Driver (ride ) / caregiver    for 24 hours after surgery --sister, lleana Patient Special Instructions ----- will do one fleet am dos Pre-Op special Instructions ----- n/a Patient verbalized understanding of instructions that were given at this phone interview. Patient denies shortness of breath, chest pain, fever, cough at this phone interview.

## 2022-12-25 ENCOUNTER — Ambulatory Visit (HOSPITAL_BASED_OUTPATIENT_CLINIC_OR_DEPARTMENT_OTHER)
Admission: RE | Admit: 2022-12-25 | Discharge: 2022-12-25 | Disposition: A | Discharge status: Home / Self Care | Payer: Medicare HMO | Attending: Urology | Admitting: Urology

## 2022-12-25 ENCOUNTER — Other Ambulatory Visit: Payer: Self-pay

## 2022-12-25 ENCOUNTER — Ambulatory Visit (HOSPITAL_COMMUNITY)
Admission: RE | Admit: 2022-12-25 | Discharge: 2022-12-25 | Disposition: A | Discharge status: Home / Self Care | Payer: Medicare HMO | Source: Ambulatory Visit | Attending: Urology | Admitting: Urology

## 2022-12-25 ENCOUNTER — Encounter (HOSPITAL_BASED_OUTPATIENT_CLINIC_OR_DEPARTMENT_OTHER)
Admission: RE | Disposition: A | Discharge status: Home / Self Care | Payer: Self-pay | Source: Home / Self Care | Attending: Urology

## 2022-12-25 ENCOUNTER — Encounter (HOSPITAL_BASED_OUTPATIENT_CLINIC_OR_DEPARTMENT_OTHER): Payer: Self-pay | Admitting: Urology

## 2022-12-25 ENCOUNTER — Ambulatory Visit (HOSPITAL_BASED_OUTPATIENT_CLINIC_OR_DEPARTMENT_OTHER): Payer: Medicare HMO

## 2022-12-25 DIAGNOSIS — Z87891 Personal history of nicotine dependence: Secondary | ICD-10-CM | POA: Insufficient documentation

## 2022-12-25 DIAGNOSIS — I10 Essential (primary) hypertension: Secondary | ICD-10-CM

## 2022-12-25 DIAGNOSIS — F418 Other specified anxiety disorders: Secondary | ICD-10-CM

## 2022-12-25 DIAGNOSIS — E0500 Thyrotoxicosis with diffuse goiter without thyrotoxic crisis or storm: Secondary | ICD-10-CM | POA: Diagnosis not present

## 2022-12-25 DIAGNOSIS — Z01818 Encounter for other preprocedural examination: Secondary | ICD-10-CM

## 2022-12-25 DIAGNOSIS — R972 Elevated prostate specific antigen [PSA]: Secondary | ICD-10-CM

## 2022-12-25 HISTORY — DX: Other seasonal allergic rhinitis: J30.2

## 2022-12-25 HISTORY — DX: Elevated prostate specific antigen (PSA): R97.20

## 2022-12-25 HISTORY — DX: Presence of spectacles and contact lenses: Z97.3

## 2022-12-25 HISTORY — DX: Personal history of adenomatous and serrated colon polyps: Z86.0101

## 2022-12-25 HISTORY — DX: Depression, unspecified: F32.A

## 2022-12-25 HISTORY — PX: PROSTATE BIOPSY: SHX241

## 2022-12-25 HISTORY — DX: Personal history of colonic polyps: Z86.010

## 2022-12-25 LAB — NO BLOOD PRODUCTS

## 2022-12-25 LAB — POCT I-STAT, CHEM 8
BUN: 16 mg/dL (ref 8–23)
Calcium, Ion: 1.12 mmol/L — ABNORMAL LOW (ref 1.15–1.40)
Chloride: 109 mmol/L (ref 98–111)
Creatinine, Ser: 1 mg/dL (ref 0.61–1.24)
Glucose, Bld: 93 mg/dL (ref 70–99)
HCT: 46 % (ref 39.0–52.0)
Hemoglobin: 15.6 g/dL (ref 13.0–17.0)
Potassium: 4.1 mmol/L (ref 3.5–5.1)
Sodium: 140 mmol/L (ref 135–145)
TCO2: 23 mmol/L (ref 22–32)

## 2022-12-25 SURGERY — BIOPSY, PROSTATE, RECTAL APPROACH, WITH US GUIDANCE
Anesthesia: Monitor Anesthesia Care | Site: Prostate

## 2022-12-25 MED ORDER — CIPROFLOXACIN IN D5W 400 MG/200ML IV SOLN
INTRAVENOUS | Status: AC
  Filled 2022-12-25: qty 200

## 2022-12-25 MED ORDER — FENTANYL CITRATE (PF) 100 MCG/2ML IJ SOLN
INTRAMUSCULAR | Status: AC
  Filled 2022-12-25: qty 2

## 2022-12-25 MED ORDER — LIDOCAINE HCL (PF) 2 % IJ SOLN
INTRAMUSCULAR | Status: DC | PRN
  Administered 2022-12-25: 60 mg via INTRADERMAL

## 2022-12-25 MED ORDER — PHENYLEPHRINE HCL (PRESSORS) 10 MG/ML IV SOLN
INTRAVENOUS | Status: DC | PRN
  Administered 2022-12-25: 120 ug via INTRAVENOUS

## 2022-12-25 MED ORDER — LIDOCAINE HCL 2 % IJ SOLN
INTRAMUSCULAR | Status: DC | PRN
  Administered 2022-12-25: 10 mL

## 2022-12-25 MED ORDER — MIDAZOLAM HCL 2 MG/2ML IJ SOLN
INTRAMUSCULAR | Status: DC | PRN
  Administered 2022-12-25: 2 mg via INTRAVENOUS

## 2022-12-25 MED ORDER — LACTATED RINGERS IV SOLN: INTRAVENOUS | Status: DC | PRN

## 2022-12-25 MED ORDER — FLEET ENEMA 7-19 GM/118ML RE ENEM: 1.0000 | ENEMA | Freq: Once | RECTAL | Status: DC

## 2022-12-25 MED ORDER — ACETAMINOPHEN 500 MG PO TABS
1000.0000 mg | ORAL_TABLET | Freq: Once | ORAL | Status: AC
  Administered 2022-12-25: 1000 mg via ORAL

## 2022-12-25 MED ORDER — EPHEDRINE SULFATE (PRESSORS) 50 MG/ML IJ SOLN
INTRAMUSCULAR | Status: DC | PRN
  Administered 2022-12-25: 10 mg via INTRAVENOUS

## 2022-12-25 MED ORDER — ACETAMINOPHEN 500 MG PO TABS
ORAL_TABLET | ORAL | Status: AC
  Filled 2022-12-25: qty 2

## 2022-12-25 MED ORDER — PROPOFOL 1000 MG/100ML IV EMUL
INTRAVENOUS | Status: AC
  Filled 2022-12-25: qty 100

## 2022-12-25 MED ORDER — LACTATED RINGERS IV SOLN
INTRAVENOUS | Status: DC
  Administered 2022-12-25: 1000 mL via INTRAVENOUS

## 2022-12-25 MED ORDER — PROPOFOL 10 MG/ML IV BOLUS
INTRAVENOUS | Status: AC
  Filled 2022-12-25: qty 20

## 2022-12-25 MED ORDER — FENTANYL CITRATE (PF) 100 MCG/2ML IJ SOLN
INTRAMUSCULAR | Status: DC | PRN
  Administered 2022-12-25: 25 ug via INTRAVENOUS

## 2022-12-25 MED ORDER — PROPOFOL 10 MG/ML IV BOLUS
INTRAVENOUS | Status: DC | PRN
  Administered 2022-12-25: 30 mg via INTRAVENOUS

## 2022-12-25 MED ORDER — CIPROFLOXACIN IN D5W 400 MG/200ML IV SOLN
400.0000 mg | INTRAVENOUS | Status: AC
  Administered 2022-12-25: 400 mg via INTRAVENOUS

## 2022-12-25 MED ORDER — MIDAZOLAM HCL 2 MG/2ML IJ SOLN
INTRAMUSCULAR | Status: AC
  Filled 2022-12-25: qty 2

## 2022-12-25 MED ORDER — PROPOFOL 500 MG/50ML IV EMUL
INTRAVENOUS | Status: DC | PRN
  Administered 2022-12-25: 150 ug/kg/min via INTRAVENOUS

## 2022-12-25 SURGICAL SUPPLY — 15 items
CLOTH BEACON ORANGE TIMEOUT ST (SAFETY) ×1 IMPLANT
GLOVE BIO SURGEON STRL SZ7.5 (GLOVE) IMPLANT
GLOVE SURG SYN 6.5 ES PF (GLOVE) ×1 IMPLANT
GLOVE SURG SYN 6.5 PF PI (GLOVE) IMPLANT
INST BIOPSY MAXCORE 18GX25 (NEEDLE) IMPLANT
INSTR BIOPSY MAXCORE 18GX20 (NEEDLE) IMPLANT
KIT TURNOVER CYSTO (KITS) ×1 IMPLANT
NDL SAFETY ECLIP 18X1.5 (MISCELLANEOUS) IMPLANT
NDL SPNL 22GX7 QUINCKE BK (NEEDLE) ×1 IMPLANT
NEEDLE SPNL 22GX7 QUINCKE BK (NEEDLE) ×1 IMPLANT
NS IRRIG 500ML POUR BTL (IV SOLUTION) ×1 IMPLANT
PAD PREP 24X48 CUFFED NSTRL (MISCELLANEOUS) ×1 IMPLANT
SLEEVE SCD COMPRESS KNEE MED (STOCKING) ×1 IMPLANT
SYR CONTROL 10ML LL (SYRINGE) IMPLANT
UNDERPAD 30X36 HEAVY ABSORB (UNDERPADS AND DIAPERS) ×2 IMPLANT

## 2022-12-25 NOTE — Anesthesia Postprocedure Evaluation (Signed)
Anesthesia Post Note  Patient: Alex Curtis  Procedure(s) Performed: BIOPSY TRANSRECTAL ULTRASONIC PROSTATE (TUBP) (Prostate)     Patient location during evaluation: PACU Anesthesia Type: MAC Level of consciousness: awake and alert Pain management: pain level controlled Vital Signs Assessment: post-procedure vital signs reviewed and stable Respiratory status: spontaneous breathing, nonlabored ventilation, respiratory function stable and patient connected to nasal cannula oxygen Cardiovascular status: stable and blood pressure returned to baseline Postop Assessment: no apparent nausea or vomiting Anesthetic complications: no  No notable events documented.  Last Vitals:  Vitals:   12/25/22 1210 12/25/22 1213  BP:    Pulse: 67 67  Resp: 20 13  Temp:  36.4 C  SpO2: 98% 100%    Last Pain:  Vitals:   12/25/22 1213  TempSrc:   PainSc: 0-No pain                 Lanah Steines L Chaye Misch

## 2022-12-25 NOTE — Anesthesia Preprocedure Evaluation (Addendum)
Anesthesia Evaluation  Patient identified by MRN, date of birth, ID band Patient awake    Reviewed: Allergy & Precautions, NPO status , Patient's Chart, lab work & pertinent test results  Airway Mallampati: II  TM Distance: >3 FB Neck ROM: Full    Dental no notable dental hx.    Pulmonary former smoker   Pulmonary exam normal breath sounds clear to auscultation       Cardiovascular hypertension, Normal cardiovascular exam Rhythm:Regular Rate:Normal     Neuro/Psych  PSYCHIATRIC DISORDERS Anxiety Depression    negative neurological ROS     GI/Hepatic negative GI ROS, Neg liver ROS,,,  Endo/Other  negative endocrine ROS  Graves Disease  Renal/GU negative Renal ROS  negative genitourinary   Musculoskeletal negative musculoskeletal ROS (+)    Abdominal   Peds  Hematology  (+) REFUSES BLOOD PRODUCTS (no albumin)  Anesthesia Other Findings   Reproductive/Obstetrics                             Anesthesia Physical Anesthesia Plan  ASA: 2  Anesthesia Plan: MAC   Post-op Pain Management: Tylenol PO (pre-op)*   Induction: Intravenous  PONV Risk Score and Plan: 2 and Ondansetron, Dexamethasone and Midazolam  Airway Management Planned: Natural Airway  Additional Equipment:   Intra-op Plan:   Post-operative Plan:   Informed Consent: I have reviewed the patients History and Physical, chart, labs and discussed the procedure including the risks, benefits and alternatives for the proposed anesthesia with the patient or authorized representative who has indicated his/her understanding and acceptance.     Dental advisory given  Plan Discussed with: CRNA  Anesthesia Plan Comments:        Anesthesia Quick Evaluation

## 2022-12-25 NOTE — Discharge Instructions (Signed)

## 2022-12-25 NOTE — Transfer of Care (Signed)
Immediate Anesthesia Transfer of Care Note  Patient: Alex Curtis  Procedure(s) Performed: BIOPSY TRANSRECTAL ULTRASONIC PROSTATE (TUBP) (Prostate)  Patient Location: PACU  Anesthesia Type:MAC  Level of Consciousness: awake and drowsy  Airway & Oxygen Therapy: Patient Spontanous Breathing and Patient connected to face mask oxygen  Post-op Assessment: Report given to RN and Post -op Vital signs reviewed and stable  Post vital signs: Reviewed and stable  Last Vitals:  Vitals Value Taken Time  BP 120/67 12/25/22 1137  Temp    Pulse 67 12/25/22 1139  Resp 24 12/25/22 1139  SpO2 100 % 12/25/22 1139  Vitals shown include unvalidated device data.  Last Pain:  Vitals:   12/25/22 0922  TempSrc: Oral  PainSc: 0-No pain      Patients Stated Pain Goal: 5 (12/25/22 1610)  Complications: No notable events documented.

## 2022-12-25 NOTE — H&P (Signed)
Office Visit Report     12/06/2022   --------------------------------------------------------------------------------   Alex Curtis  MRN: [id number]  DOB: [birth year], 67 year old Male  PRIMARY CARE:    REFERRING:  Benjiman Core, [place]  PROVIDER:  Rhoderick Moody, M.D.  LOCATION:  [facility] [phone]     --------------------------------------------------------------------------------   CC/HPI: CC: Elevated PSA   HPI: Mr. Alex Curtis is a 67 year old male referred by Dr. Lamona Curl for evaluation of an elevated PSA value.   Last PSA: 8.54 (10/2022), 8.5 (2014)-- No family history of prostate cancer   From a urinary standpoint, he reports a good FOS and feels like he is emptying his bladder well. He has occasional urgency/frequency, but is not bothered by it. Nocturia x 1. Denies a prior history of GU surgery/trauam, UTIs, or hematuria.   AUASS: 4/1  SHIM: 25     ALLERGIES: No Allergies    MEDICATIONS: Lisinopril 10 mg tablet     GU PSH: Vasectomy - 2014       PSH Notes: Surgery Of Male Genitalia Vasectomy   NON-GU PSH: No Non-GU PSH    GU PMH: BPH w/LUTS, Benign localized prostatic hyperplasia with lower urinary tract symptoms (LUTS) - 2014 Elevated PSA, Elevated prostate specific antigen (PSA) - 2014 Encounter for Prostate Cancer screening, Prostate cancer screening - 2014    NON-GU PMH: Personal history of other diseases of the circulatory system, History of hypertension - 2014 Encounter for general adult medical examination without abnormal findings, Encounter for preventive health examination    FAMILY HISTORY: 1 Daughter - Daughter 1 son - Son Family Health Status - Mother's Age - Runs In Family Family Health Status Number - Runs In Family Father Deceased At [employer] ___ - Runs In Family Hypertension - Runs In Family   SOCIAL HISTORY: Marital Status: Single Preferred Language: English; Ethnicity: Not Hispanic Or Latino; Race: White Current  Smoking Status: Patient has never smoked.   Tobacco Use Assessment Completed: Used Tobacco in last 30 days? Has never drank.  Drinks 1 caffeinated drink per day.     Notes: Never A Smoker, Caffeine Use, Marital History - Currently Married, Alcohol Use, Occupation:   REVIEW OF SYSTEMS:    GU Review Male:   Patient denies frequent urination, hard to postpone urination, burning/ pain with urination, get up at night to urinate, leakage of urine, stream starts and stops, trouble starting your stream, have to strain to urinate , erection problems, and penile pain.  Gastrointestinal (Upper):   Patient denies nausea, vomiting, and indigestion/ heartburn.  Gastrointestinal (Lower):   Patient denies diarrhea and constipation.  Constitutional:   Patient denies fever, night sweats, weight loss, and fatigue.  Skin:   Patient denies skin rash/ lesion and itching.  Eyes:   Patient denies blurred vision and double vision.  Ears/ Nose/ Throat:   Patient denies sore throat and sinus problems.  Hematologic/Lymphatic:   Patient denies swollen glands and easy bruising.  Cardiovascular:   Patient denies chest pains and leg swelling.  Respiratory:   Patient denies cough and shortness of breath.  Endocrine:   Patient denies excessive thirst.  Musculoskeletal:   Patient denies back pain and joint pain.  Neurological:   Patient denies headaches and dizziness.  Psychologic:   Patient denies depression and anxiety.   VITAL SIGNS:      12/06/2022 02:17 PM  Weight 165 lb / 74.84 kg  Height 68 in / 172.72 cm  BP 134/2 mmHg  Pulse 76 /  min  BMI 25.1 kg/m   GU PHYSICAL EXAMINATION:    Anus and Perineum: No hemorrhoids. No anal stenosis. No rectal fissure, no anal fissure. No edema, no dimple, no perineal tenderness, no anal tenderness.  Scrotum: No lesions. No edema. No cysts. No warts.  Epididymides: Right: no spermatocele, no masses, no cysts, no tenderness, no induration, no enlargement. Left: no spermatocele,  no masses, no cysts, no tenderness, no induration, no enlargement.  Testes: No tenderness, no swelling, no enlargement left testes. No tenderness, no swelling, no enlargement right testes. Normal location left testes. Normal location right testes. No mass, no cyst, no varicocele, no hydrocele left testes. No mass, no cyst, no varicocele, no hydrocele right testes.  Urethral Meatus: Normal size. No lesion, no wart, no discharge, no polyp. Normal location.  Penis: Circumcised, no warts, no cracks. No dorsal Peyronie's plaques, no left corporal Peyronie's plaques, no right corporal Peyronie's plaques, no scarring, no warts. No balanitis, no meatal stenosis.  Prostate: 40 gram or 2+ size. Left lobe normal consistency, right lobe normal consistency. Symmetrical lobes. No prostate nodule. Left lobe no tenderness, right lobe no tenderness.  Seminal Vesicles: Nonpalpable.  Sphincter Tone: Normal sphincter. No rectal tenderness. No rectal mass.    MULTI-SYSTEM PHYSICAL EXAMINATION:       Complexity of Data:  Lab Test Review:   PSA  Records Review:   Previous Patient Records  Urine Test Review:   Urinalysis   12/31/12  PSA  Total PSA 6.79   Free PSA 1.22   % Free PSA 18     PROCEDURES:          Urinalysis Dipstick Dipstick Cont'd  Color: Yellow Bilirubin: Neg mg/dL  Appearance: Clear Ketones: Neg mg/dL  Specific Gravity: 1.610 Blood: Neg ery/uL  pH: 8.5 Protein: Trace mg/dL  Glucose: Neg mg/dL Urobilinogen: 0.2 mg/dL    Nitrites: Neg    Leukocyte Esterase: Neg leu/uL    ASSESSMENT:      ICD-10 Details  1 GU:   Elevated PSA - R97.20 Undiagnosed New Problem   PLAN:           Schedule Return Visit/Planned Activity: Next Available Appointment - Schedule Surgery          Document Letter(s):  Created for Patient: Clinical Summary         Notes:   -We discussed the general pros and cons of PSA screening, with the benefits being early cancer detection, hopefully allowing for treatment  while curable, and with the cons being anxiety and need for further invasive testing (prostate biopsy) and treatment which may pose more substantial risks. We discussed specific pros/cons in direct relationship to his overall health status and life expectancy.   I extensively discussed the option of prostate biopsy. I specifically discussed potential risks including but not limited to the possibility of blood per rectum, per urethra and in the ejaculate, urinary retention and the possibility for UTI/bacteremia. He voices understanding.   -Plan for PNBx in the OR

## 2022-12-25 NOTE — Op Note (Signed)
Operative Note  Preoperative diagnosis:  1.  Elevated PSA  Postoperative diagnosis: 1.  Elevated PSA  Procedure(s): 1.  Transrectal ultrasound-guided prostate biopsy  Surgeon: Rhoderick Moody, MD  Assistants:  None  Anesthesia:  General  Complications:  None  EBL: Less than 5 mL  Specimens: 1.  12 needle biopsies were taken from each sextant of the prostate  Drains/Catheters: 1.  None  Intraoperative findings:   Prostate volume was 72 cm No hypoechoic lesions were seen  Indication:  Alex Curtis is a 67 y.o. male with a PSA of 8.5 who is here today for a prostate biopsy for further investigation.  He has been consented for the above procedures, voices understanding and wishes to proceed.  Description of procedure:  After informed consent was obtained, the patient was brought to the operating room and MAC anesthesia was administered.  The patient was placed in the lateral decubitus position and prepped in usual fashion.  A timeout was then performed.  Transrectal ultrasound probe was inserted into the rectum and prostate dimensions were obtained, revealing a prostate volume of approximately 72 cm.  I then proceeded to take core needle biopsies from each sextant of the prostate with a total of 12 specimens collected.  Patient tolerated the procedure well and was transferred to the postanesthesia in stable condition.  Plan:  Follow-up in 1 week to discuss biopsy results

## 2022-12-26 ENCOUNTER — Encounter (HOSPITAL_BASED_OUTPATIENT_CLINIC_OR_DEPARTMENT_OTHER): Payer: Self-pay | Admitting: Urology

## 2022-12-26 LAB — SURGICAL PATHOLOGY

## 2023-06-10 ENCOUNTER — Encounter: Payer: Self-pay | Admitting: Gastroenterology
